# Patient Record
Sex: Female | Born: 1945 | Race: White | Hispanic: No | Smoking: Never smoker
Health system: Northeastern US, Academic
[De-identification: ages and names within clinical notes are randomized; demographics above are authoritative.]

---

## 2016-01-15 ENCOUNTER — Ambulatory Visit: Admitting: Nurse Practitioner

## 2016-09-29 ENCOUNTER — Ambulatory Visit

## 2016-10-02 LAB — HX ZZYYLEAD (VENOUS)
CASE NUMBER: 2018122001164
HX LEAD (VENOUS): <2

## 2016-10-02 LAB — HX ZINC PROTOPORPHYRIN (ZPP)
CASE NUMBER: 2018122001164
HX ZPP TO HEME RATIO: 44

## 2017-09-30 ENCOUNTER — Ambulatory Visit

## 2017-09-30 LAB — HX GLOMERULAR FILTRATION RATE (ESTIMATED)
CASE NUMBER: 2019123003744
HX AFN AMER GLOMERULAR FILTRATION RATE: 56 mL/min/{1.73_m2}
HX NON-AFN AMER GLOMERULAR FILTRATION RATE: 48 mL/min/{1.73_m2}

## 2017-09-30 LAB — HX BASIC METABOLIC PANEL
CASE NUMBER: 2019123003744
HX ANION GAP: 7 — NL (ref 3.0–11.0)
HX BUN: 18 mg/dL — NL (ref 8.0–23.0)
HX CALCIUM LVL: 9.9 mg/dL — NL (ref 8.5–10.5)
HX CHLORIDE: 107 mmol/L — NL (ref 98.0–110.0)
HX CO2: 29 mmol/L — NL (ref 21.0–32.0)
HX CREATININE: 1.14 mg/dL — NL (ref 0.55–1.3)
HX GLUCOSE LVL: 83 mg/dL — NL (ref 70.0–110.0)
HX POTASSIUM LVL: 4.3 mmol/L — NL (ref 3.6–5.2)
HX SODIUM LVL: 143 mmol/L — NL (ref 136.0–146.0)

## 2017-09-30 LAB — HX HEMOGLOBIN A1C
CASE NUMBER: 2019123003744
HX EST AVERAGE GLUCOSE (EAG): 151 mg/dL
HX HEMOGLOBIN A1C: 6.9 % — ABNORMAL HIGH
HX LA1C (INTERNAL): 1.8 % — NL
HX P3 PEAK (INTERNAL): 4.1 % — NL
HX P4 PEAK (INTERNAL): 1.4 % — NL
HX TOTAL AREA RANGE (INTERNAL): 2.38 microvolt/sec — NL (ref 1.0–3.5)

## 2018-01-10 ENCOUNTER — Ambulatory Visit: Admitting: Nurse Practitioner

## 2018-01-10 LAB — HX BASIC METABOLIC PANEL
CASE NUMBER: 2019225003621
HX ANION GAP: 10 — NL (ref 3.0–11.0)
HX BUN: 28 mg/dL — ABNORMAL HIGH (ref 8.0–23.0)
HX CALCIUM LVL: 9.3 mg/dL — NL (ref 8.5–10.5)
HX CHLORIDE: 106 mmol/L — NL (ref 98.0–110.0)
HX CO2: 26 mmol/L — NL (ref 21.0–32.0)
HX CREATININE: 1.35 mg/dL — ABNORMAL HIGH (ref 0.55–1.3)
HX GLUCOSE LVL: 115 mg/dL — ABNORMAL HIGH (ref 70.0–110.0)
HX POTASSIUM LVL: 4.6 mmol/L — NL (ref 3.6–5.2)
HX SODIUM LVL: 142 mmol/L — NL (ref 136.0–146.0)

## 2018-01-10 LAB — HX GLOMERULAR FILTRATION RATE (ESTIMATED)
CASE NUMBER: 2019225003621
HX AFN AMER GLOMERULAR FILTRATION RATE: 46 mL/min/{1.73_m2}
HX NON-AFN AMER GLOMERULAR FILTRATION RATE: 40 mL/min/{1.73_m2}

## 2018-01-10 LAB — HX LAVENDER TOP TO HOLD: CASE NUMBER: 2019225003623

## 2018-01-10 LAB — HX LIPID PANEL
CASE NUMBER: 2019225003621
HX CHOL: 108 mg/dL — NL
HX HDL: 45 mg/dL — NL
HX LDL: 31 mg/dL — NL
HX TRIG: 160 mg/dL — ABNORMAL HIGH

## 2018-01-10 LAB — HX SST GOLD TUBE TO HOLD: CASE NUMBER: 2019225003623

## 2018-01-11 LAB — HX HEMOGLOBIN A1C
CASE NUMBER: 2019225003621
HX EST AVERAGE GLUCOSE (EAG): 146 mg/dL
HX HEMOGLOBIN A1C: 6.7 % — ABNORMAL HIGH
HX LA1C (INTERNAL): 1.8 % — NL
HX P3 PEAK (INTERNAL): 4.2 % — NL
HX P4 PEAK (INTERNAL): 1.3 % — NL
HX TOTAL AREA RANGE (INTERNAL): 1.9 microvolt/sec — NL (ref 1.0–3.5)

## 2018-03-13 ENCOUNTER — Ambulatory Visit: Admitting: Nurse Practitioner

## 2019-04-02 ENCOUNTER — Ambulatory Visit

## 2020-12-10 ENCOUNTER — Inpatient Hospital Stay
Admit: 2020-12-10 | Discharge: 2020-12-11 | Disposition: A | Discharge status: Home / Self Care | Payer: MEDICARE | Attending: Emergency Medicine

## 2020-12-10 ENCOUNTER — Other Ambulatory Visit

## 2020-12-10 DIAGNOSIS — I10 Essential (primary) hypertension: Secondary | ICD-10-CM

## 2020-12-10 LAB — URINALYSIS REFLEX TO CULTURE
Bilirubin, Ur: NEGATIVE
Bilirubin, Ur: NEGATIVE
Glucose,Ur: NEGATIVE mg/dL
Glucose,Ur: NEGATIVE mg/dL
Ketones, Ur: 5 mg/dL — AB
Ketones, Ur: 5 mg/dL — AB
Leukocyte Esterase, Ur: 250 WBC/uL — AB
Leukocyte Esterase, Ur: 250 WBC/uL — AB
Nitrite, Ur: NEGATIVE
Nitrite, Ur: NEGATIVE
Protein,Ur: NEGATIVE mg/dL
Protein,Ur: NEGATIVE mg/dL
RBC, Ur: 1 /HPF (ref 0–2)
RBC, Ur: 1 /HPF (ref 0–2)
Specific Gravity, Ur: 1.002 — ABNORMAL LOW (ref 1.003–1.030)
Specific Gravity, Ur: 1.002 — ABNORMAL LOW (ref 1.003–1.030)
Squamous Epithelial Cells, Ur: 8 /HPF — ABNORMAL HIGH (ref 0–5)
Squamous Epithelial Cells, Ur: 8 /HPF — ABNORMAL HIGH (ref 0–5)
Urobilinogen, Ur: NEGATIVE
Urobilinogen, Ur: NEGATIVE
WBC, Ur: 5 /HPF (ref 0–5)
WBC, Ur: 5 /HPF (ref 0–5)
pH, Ur: 5 (ref 5.0–8.9)
pH, Ur: 5 (ref 5.0–8.9)

## 2020-12-10 LAB — URINE DRUG SCREEN
Amphetamines screen, urine: NEGATIVE
Barbiturates screen, urine: NEGATIVE
Benzodiazepines screen, urine: NEGATIVE
Buprenorphine screen, urine: NEGATIVE
Cannabinoids screen, urine: NEGATIVE
Cocaine screen, urine: NEGATIVE
Creatinine, urine, specimen validity: 300 mg/dL (ref >=20.00)
Ethanol, Urine, Value: <3 mg/dL (ref <=10)
Fentanyl screen, urine: NEGATIVE
Methadone screen, urine: NEGATIVE
Opiates screen, urine: NEGATIVE
Oxycodone screen, urine: NEGATIVE

## 2020-12-10 LAB — CBC WITH DIFFERENTIAL
Basophils %: 0.4 %
Basophils Absolute: 0.04 10*3/uL (ref 0.00–0.22)
Eosinophils %: 0.4 %
Eosinophils Absolute: 0.05 10*3/uL (ref 0.00–0.50)
Hematocrit: 42.7 % (ref 32.0–47.0)
Hemoglobin: 14.5 g/dL (ref 11.0–16.0)
Immature Granulocytes %: 0.2 %
Immature Granulocytes Absolute: 0.02 10*3/uL (ref 0.00–0.10)
Lymphocyte %: 19.7 %
Lymphocytes Absolute: 2.24 10*3/uL (ref 0.70–4.00)
MCH: 31.2 pg (ref 26.0–34.0)
MCHC: 34 g/dL (ref 31.0–37.0)
MCV: 91.8 fL (ref 80.0–100.0)
MPV: 9 fL — ABNORMAL LOW (ref 9.1–12.4)
Monocytes %: 6.5 %
Monocytes Absolute: 0.74 10*3/uL (ref 0.36–0.77)
NRBC %: 0 % (ref 0.0–0.0)
NRBC Absolute: 0 10*3/uL (ref 0.00–2.00)
Neutrophil %: 72.8 %
Neutrophils Absolute: 8.29 10*3/uL — ABNORMAL HIGH (ref 1.50–7.95)
Platelets: 270 10*3/uL (ref 150–400)
RBC: 4.65 M/uL (ref 3.70–5.20)
RDW-CV: 12.9 % (ref 11.5–14.5)
RDW-SD: 43.3 fL (ref 35.0–51.0)
WBC: 11.4 10*3/uL — ABNORMAL HIGH (ref 4.0–11.0)

## 2020-12-10 LAB — COMPREHENSIVE METABOLIC PANEL
ALT: 36 U/L (ref 0–55)
AST: 26 U/L (ref 6–42)
Albumin: 4.1 g/dL (ref 3.2–5.0)
Alkaline phosphatase: 134 U/L — ABNORMAL HIGH (ref 30–130)
Anion Gap: 5 mmol/L (ref 3–14)
BUN: 16 mg/dL (ref 6–24)
Bilirubin, total: 0.5 mg/dL (ref 0.2–1.2)
CO2 (Bicarbonate): 26 mmol/L (ref 20–32)
Calcium: 9.8 mg/dL (ref 8.5–10.5)
Chloride: 108 mmol/L (ref 98–110)
Creatinine: 1.38 mg/dL — ABNORMAL HIGH (ref 0.55–1.30)
Glucose: 106 mg/dL (ref 70–110)
Potassium: 3.9 mmol/L (ref 3.6–5.2)
Protein, total: 7.7 g/dL (ref 6.0–8.4)
Sodium: 139 mmol/L (ref 135–146)
eGFRcr: 40 mL/min/{1.73_m2} — ABNORMAL LOW (ref >=60)

## 2020-12-10 LAB — TROPONIN I, HIGH SENSITIVITY: Troponin I, High Sensitivity: <6 ng/L (ref <=59)

## 2020-12-10 LAB — TSH WITH REFLEX: TSH: 3.03 u[IU]/mL (ref 0.358–3.740)

## 2020-12-10 MED ORDER — nitrofurantoin, macrocrystal-monohydrate, (Macrobid) 100 mg capsule
100 | ORAL_CAPSULE | Freq: Two times a day (BID) | ORAL | 0 refills | 6.00000 days | Status: AC
Start: 2020-12-10 — End: 2020-12-15

## 2020-12-10 NOTE — ED Provider Notes (Signed)
 HPI   Chief Complaint   Patient presents with   ? Altered Mental Status         History provided by:  Patient and relative  Mental Health Problem  Presenting symptoms: agitation    Degree of incapacity (severity):  Moderate  Onset quality:  Gradual  Progression:  Waxing and waning  Chronicity:  New  Context: noncompliance    Treatment compliance:  Untreated  Relieved by:  Nothing                Glasgow Coma Scale Score: 15                                  Patient History   No past medical history on file.  No past surgical history on file.  No family history on file.  Social History     Tobacco Use   ? Smoking status: Not on file   ? Smokeless tobacco: Not on file   Substance Use Topics   ? Alcohol use: Not on file   ? Drug use: Not on file       Review of Systems   Review of Systems   Constitutional: Negative for chills and fever.   HENT: Negative for congestion and trouble swallowing.    Eyes: Negative for discharge and visual disturbance.   Respiratory: Negative for cough and shortness of breath.    Cardiovascular: Negative for palpitations and leg swelling.   Gastrointestinal: Negative for diarrhea and vomiting.   Endocrine: Negative for polydipsia and polyphagia.   Genitourinary: Negative for dysuria and hematuria.   Musculoskeletal: Negative for gait problem.   Neurological: Negative for seizures and syncope.   Psychiatric/Behavioral: Positive for agitation and confusion.       Physical Exam   ED Triage Vitals [12/10/20 1810]   Temp Pulse Resp BP   36.7 ?C (98 ?F) 92 16 (!) 188/79      SpO2 Temp Source Heart Rate Source Patient Position   96 % Oral Monitor Lying      BP Location FiO2 (%)     Left arm --       Physical Exam  HENT:      Head: Normocephalic and atraumatic.   Eyes:      General:         Right eye: No discharge.         Left eye: No discharge.      Pupils: Pupils are equal, round, and reactive to light.   Cardiovascular:      Rate and Rhythm: Normal rate and regular rhythm.   Pulmonary:       Effort: Pulmonary effort is normal. No respiratory distress.   Abdominal:      General: Abdomen is flat.      Tenderness: There is no abdominal tenderness.   Musculoskeletal:         General: No swelling.      Cervical back: No rigidity or tenderness.   Skin:     General: Skin is warm and dry.   Neurological:      Mental Status: She is alert.      Comments: Oriented to self only, moving all extremities       No orders to display       Labs Reviewed   COMPREHENSIVE METABOLIC PANEL - Abnormal       Result Value    Sodium 139  Potassium 3.9      Chloride 108      CO2 (Bicarbonate) 26      Anion Gap 5      BUN 16      Creatinine 1.38 (*)     eGFRcr 40 (*)     Glucose 106      Fasting? No      Calcium 9.8      AST 26      ALT 36      Alkaline phosphatase 134 (*)     Protein, total 7.7      Albumin 4.1      Bilirubin, total 0.5     CBC WITH DIFFERENTIAL - WAM AND NON-WAM - Abnormal    WBC 11.4 (*)     RBC 4.65      Hemoglobin 14.5      Hematocrit 42.7      MCV 91.8      MCH 31.2      MCHC 34.0      RDW-CV 12.9      RDW-SD 43.3      Platelets 270      MPV 9.0 (*)     Neutrophil % 72.8      Lymphocyte % 19.7      Monocytes % 6.5      Eosinophils % 0.4      Basophils % 0.4      Immature Granulocytes % 0.2      NRBC % 0.0      Neutrophils Absolute 8.29 (*)     Lymphocytes Absolute 2.24      Monocytes Absolute 0.74      Eosinophils Absolute 0.05      Basophils Absolute 0.04      Immature Granulocytes Absolute 0.02      NRBC Absolute 0.00     URINALYSIS REFLEX TO CULTURE - Abnormal    Color, Ur Straw (*)     Clarity, Ur Hazy (*)     Specific Gravity, Ur 1.002 (*)     pH, Ur 5.0      Protein,Ur Negative      Glucose,Ur Negative      Ketones, Ur 5  (*)     Bilirubin, Ur Negative      Blood, Ur Small (*)     Urobilinogen, Ur Negative      Nitrite, Ur Negative      Leukocyte Esterase, Ur 250  (*)     RBC, Ur 1      WBC, Ur 5      Squamous Epithelial Cells, Ur 8 (*)     Bacteria, Ur 4+ (*)     Mucous, Ur Few (*)    URINE DRUG  SCREEN - Normal    Amphetamines screen, urine Screen Negative      Barbiturates screen, urine Screen Negative      Benzodiazepines screen, urine Screen Negative      Buprenorphine screen, urine Screen Negative      Cannabinoids screen, urine Screen Negative      Cocaine screen, urine Screen Negative      Ethanol, Urine, Value <3      Fentanyl screen, urine Screen Negative      Methadone screen, urine Screen Negative      Opiates screen, urine Screen Negative      Oxycodone screen, urine Screen Negative      Creatinine, urine, specimen validity 300.00      Narrative:     These  are unconfirmed screening results and should be used only for medical purposes. If the clinical setting requires confirmation, contact the clinical laboratory.     TSH WITH REFLEX - Normal    TSH 3.030     TROPONIN I, HIGH SENSITIVITY - Normal    Troponin I, High Sensitivity <6     URINE CULTURE   CBC W/DIFF    Narrative:     The following orders were created for panel order CBC and differential.  Procedure                               Abnormality         Status                     ---------                               -----------         ------                     CBC w/ Differential[[id number]]           Abnormal            Final result                 Please view results for these tests on the individual orders.   URINALYSIS REFLEX TO CULTURE       ED Course & MDM   Diagnoses as of 12/10/20 2051   Hypertension, unspecified type   Agitation       MDM  Number of Diagnoses or Management Options  Agitation  Hypertension, unspecified type  Diagnosis management comments: 75 year old female with history of dementia presents from home with agitation.  She lives with her daughter.  Per EMS, she was cleaning today and then started throwing things down off the walls.  Her family was concerned and called EMS.  The patient here is without complaints.  She says she does not remember what happened earlier or why she is here.  She denies fever chills cough  nausea or vomiting.  She states she has prescribed medications but does not take them.  She is oriented to self only she is moving all her extremities.  No report of fall.    Patient with dementia and agitation not taking medications.  Medically cleared for [facility] evaluation.    Seen by [facility], plan for elder services, PCP follow up to restart medications. UA with only 5 WBC, however, given concern for behavioral changes, will cover with macrobid.             Norval Gable, MD  12/10/20 2052

## 2020-12-10 NOTE — ED Notes (Signed)
 Assumed care of pt at this time. Pt is sitting on edge of bed. Pt is anxious, stating she's been here for "two hours" and staff is "all sitting around laughing and talking" and no one is helping her. Explained Section 12 to patient and explained that [facility] has been called, but that it could be a while before they come to see patient. Explained that pt was sectioned due to her behavior with paramedics. Pt explained that there were "just so many people" around her at the time and she was scared. Asked pt if she remembers what happened today when she was cleaning. Pt states she was just cleaning the house today. Asked pt if she remembers throwing things and pt stated that was her son that did that, not her.     Penelope Galas, RN  12/10/20 1921

## 2020-12-10 NOTE — Other (Signed)
 Patient Education   Table of Contents       Dementia Caregiver Guide     To view videos and all your education online visit,   [email or web address].elsevier.[email or web address]   or scan this QR code with your smartphone.                    Dementia Caregiver Guide     Dementia is a term used to describe a number of symptoms that affect memory and thinking. The most common symptoms include:       Memory loss.       Trouble with language and communication.       Trouble concentrating.       Poor judgment and problems with reasoning.       Wandering from home or public places.       Extreme anxiety or depression.       Being suspicious or having angry outbursts and accusations.       Child-like behavior and language.     Dementia can be frightening and confusing. And taking care of someone with dementia can be challenging. This guide provides tips to help you when providing care for a person with dementia.   How to help manage lifestyle changes   Dementia usually gets worse slowly over time. In the early stages, people with dementia can stay independent and safe with some help. In later stages, they need help with daily tasks such as dressing, grooming, and using the bathroom. There are actions you can take to help a person manage his or her life while living with this condition.   Communicating         When the person is talking or seems frustrated, make eye contact and hold the person's hand.       Ask specific questions that need yes or no answers.       Use simple words, short sentences, and a calm voice. Only give one direction at a time.       When offering choices, limit the person to just one or two.       Avoid correcting the person in a negative way.       If the person is struggling to find the right words, gently try to help him or her.     Preventing injury            Keep floors clear of clutter. Remove rugs, magazine racks, and floor lamps.       Keep hallways well lit, especially at night.       Put a handrail and  nonslip mat in the bathtub or shower.       Put childproof locks on cabinets that contain dangerous items, such as medicines, alcohol, guns, toxic cleaning items, sharp tools or utensils, matches, and lighters.       For doors to the outside of the house, put the locks in places where the person cannot see or reach them easily. This will help ensure that the person does not wander out of the house and get lost.       Be prepared for emergencies. Keep a list of emergency phone numbers and addresses in a convenient area.       Remove car keys and lock garage doors so that the person does not try to get in the car and drive.       Have the person wear a bracelet that tracks locations and identifies the person  as having memory problems. This should be worn at all times for safety.     Helping with daily life            Keep the person on track with his or her routine.       Try to identify areas where the person may need help.       Be supportive, patient, calm, and encouraging.       Gently remind the person that adjusting to changes takes time.       Help with the tasks that the person has asked for help with.       Keep the person involved in daily tasks and decisions as much as possible.       Encourage conversation, but try not to get frustrated if the person struggles to find words or does not seem to appreciate your help.         How to recognize stress    Look for signs of stress in yourself and in the person you are caring for. If you notice signs of stress, take steps to manage it. Symptoms of stress include:       Feeling anxious, irritable, frustrated, or angry.       Denying that the person has dementia or that his or her symptoms will not improve.       Feeling depressed, hopeless, or unappreciated.       Difficulty sleeping.       Difficulty concentrating.       Developing stress-related health problems.       Feeling like you have too little time for your own life.     Follow these instructions at home:    Take care of your health       Make sure that you and the person you are caring for:       Get regular sleep.       Exercise regularly.       Eat regular, nutritious meals.       Take over-the-counter and prescription medicines only as told by your health care providers.       Drink enough fluid to keep your urine pale yellow.       Attend all scheduled health care appointments.     General instructions         Join a support group with others who are caregivers.       Ask about respite care resources. Respite care can provide short-term care for the person so that you can have a regular break from the stress of caregiving.       Consider any safety risks and take steps to avoid them.       Organize medicines in a pill box for each day of the week.       Create a plan to handle any legal or financial matters. Get legal or financial advice if needed.       Keep a calendar in a central location to remind the person of appointments or other activities.       Where to find support:    Many individuals and organizations offer support. These include:       Support groups for people with dementia.       Support groups for caregivers.       Counselors or therapists.       Home health care services.       Adult day care centers.  Where to find more information         Centers for Disease Control and Prevention: FootballExhibition.com.br         Alzheimer's Association: LimitLaws.hu         Family Caregiver Alliance: www.caregiver.org         Alzheimer's Foundation of [place]: www.alzfdn.org       Contact a health care provider if:         The person's health is rapidly getting worse.       You are no longer able to care for the person.       Caring for the person is affecting your physical and emotional health.       You are feeling depressed or anxious about caring for the person.     Get help right away if:         The person threatens himself or herself, you, or anyone else.       You feel depressed or sad, or feel that you want to  harm yourself.     If you ever feel like your loved one may hurt himself or herself or others, or if he or she shares thoughts about taking his or her own life, get help right away. You can go to your nearest emergency department or:      Call your local emergency services (911 in the [place]      Call a suicide crisis helpline, such as the National Suicide Prevention Lifeline at 7572198743. This is open 24 hours a day in the [place]      Text the Crisis Text Line at (769)880-4749 (in the [place]     Summary         Dementia is a term used to describe a number of symptoms that affect memory and thinking.       Dementia usually gets worse slowly over time.       Take steps to reduce the person's risk of injury and to plan for future care.       Caregivers need support, relief from caregiving, and time for their own lives.     This information is not intended to replace advice given to you by your health care provider. Make sure you discuss any questions you have with your health care provider.     Document Released: 11/21/2017Document Revised: 05/03/2021Document Reviewed: 10/01/2019     Elsevier Patient Education ? 2022 Elsevier Inc.

## 2020-12-10 NOTE — ED Triage Notes (Signed)
 Pt placed on section 12 as EMS was unable to get pt to come to ed. On the way to ED pt would not stop fighting with ems and kept taking her seat belt off.

## 2020-12-10 NOTE — ED Triage Notes (Signed)
 BIBA from home. Family states she has had a rapid decline in mental status. Pt does have dementia. Family states she was "rapidly cleaning and then began taking things off the wall and throwing them". On arrival to ED pt confused but calm and cooperative.

## 2020-12-10 NOTE — ED Notes (Signed)
 Called [facility] for referral, per Dr. Felipa Furnace.     Penelope Galas, RN  12/10/20 1908

## 2020-12-10 NOTE — Discharge Instructions (Signed)
 Follow up with PCP for formal dementia testing and to get restarted on blood pressure medications.

## 2020-12-10 NOTE — ED Progress Note (Signed)
 DARP    Risk Factors:  Denies SI/HI and PS's daughter also reports no SI/HI concern. There is concern of dementia      Protective Factors:  Support from family    Suicide Risk Level:  Low risk.    Action:  PS is a 75 y/o widowed female who was BIBA to [facility] ED via Sec. 12 by [place] [employer]. PS present alert and is oriented to self but disoriented to location, time, and situation. PS would answer questions with, ?That?s a good question.? PS would share that her daughter should be the one to answer the questions.  PS reports he was last at home and how she had coffee and bread as her breakfast. PS would report she has been waiting in the ED for 6-7 hours. PS has only been at the ED for an hour. PS would deny SI/HI and would report how she and her family like to help people ?not hurt them.? PS would deny AH/VH and does not present with any internal stimuli. PS reports she feels healthy and wants to return home.      Response:  Spoke with Officer Boykin Reaper with [place] [employer] who was a Engineer, drilling.  Officer Jeannette Corpus reports that the call was from [employer] to assist their EMS team with a client. Officer Jeannette Corpus reports that upon arrival they found PS refusing care and she appeared confused. Officer Jeannette Corpus reports that the family was interviewed and there was concern that she had been removing items around the home and putting them in her room and making a pile on her bed. Officer Miano reports that PS had also been acting aggressive towards her daughter. Officer Miano reports that PS?s blood pressure was considerably high and EMS recommended medical eval and so the officers assisted in getting PS to remain on the gurney and brought to the ED for her safety. Officer Jeannette Corpus was able to share the phone numbers to PS?s daughter and daughter-in-law who were present at the home.     Met with PS?s daughter, [name], at the ED. [name] reports that she, her husband, and their daughter moved in to take care of PS back  in 2019. [name] reports that PS had retired in January 2019.[name] reports that PS has refused to regular check-ups with her PCP and has also been refusing to take her prescribed medications. [name] reports that over the last six months she has noticed PS become progressively worse with her memory and that there is concern for dementia. [name] reports that PS?s mother had been dx with dementia around the same age and passed away at age 41. [name] reports that PS has been a widow since 2012. [name] reports that the family had owned a cat for a few months but recently had to give the cat away and this has likely affected PS as well. [name] reports how PS was quite bonded with the cat. [name] reports that PS would present confused at times and share how the cat was still around or would even think her granddaughter is the cat. [name] reports that PS has struggled lately with communicating and she appears to become agitated from this. [name] reports that today PS presented more confused than seen before and how PS had spend considerable time out in the sun so there was concern of dehydration and impact to her blood pressure. [name] reports that given how PS was being aggressive towards the family when they were trying to help, the decision was made to seek  help and so 911 was called. [name] reports PS did not want to be checked by the EMS and was not agreeing to the come to the ED. [name] reports PS has not endorsed SI/HI and there has been no AH/VH. [name] is open to having PS return home and they hope for her to engage with the PCP for further review on her conditions.     Plan:  At this time clinical formulation supports D/C. PS is a 75 y/o female who presents with anxious mood and mood congruent affect. PS presents alert and oriented to self but disoriented to location, time, and situation. PS presents confused but is cooperative and remained calm during the eval. PS would answer  questions with, ?That?s a good question.? PS would share that her daughter should be the one to answer the questions. PS would deny SI/HI and would report how she and her family like to help people ?not hurt them.? PS would deny AH/VH and does not present with any internal stimuli. PS reports she feels healthy and wants to return home.      PS?s daughter has concerns of dementia and shares how PS?s memory has been progressively getting worse. There is family hx of dementia. PS?s daughter reports PS has not been taking her medications and also has not been willing to engage with her PCP. PS?s daughter reports PS has been increasing confused and more agitated. Today it was difficult to understand what PS was trying to communicate and so this led her to become aggressive. PS?s daughter also had medical concerns given PS?s hx of high blood pressure and wanted PS to be checked out medically. PS?s daughter reports PS has not endorsed SI/HI and there has been no AH/VH. PS?s daughter is open to having PS return home and they hope for her to engage with the PCP for further review on her conditions.      [facility] supports the disposition.    Attending Physician- Dr. Norval Gable is aware of the disposition.

## 2020-12-26 ENCOUNTER — Other Ambulatory Visit (HOSPITAL_BASED_OUTPATIENT_CLINIC_OR_DEPARTMENT_OTHER)

## 2020-12-26 ENCOUNTER — Ambulatory Visit: Admit: 2021-02-26 | Payer: MEDICARE

## 2020-12-27 ENCOUNTER — Ambulatory Visit (HOSPITAL_BASED_OUTPATIENT_CLINIC_OR_DEPARTMENT_OTHER)

## 2020-12-27 LAB — HEPATIC FUNCTION PANEL
ALT: 34 U/L (ref 0–55)
AST: 26 U/L (ref 6–42)
Albumin: 3.8 g/dL (ref 3.2–5.0)
Alkaline phosphatase: 122 U/L (ref 30–130)
Bilirubin, direct: 0.1 mg/dL (ref 0.0–0.5)
Bilirubin, total: 0.6 mg/dL (ref 0.2–1.2)
Protein, total: 6.9 g/dL (ref 6.0–8.4)

## 2020-12-27 LAB — BASIC METABOLIC PANEL
Anion Gap: 5 mmol/L (ref 3–14)
BUN: 17 mg/dL (ref 6–24)
CO2 (Bicarbonate): 29 mmol/L (ref 20–32)
Calcium: 9.1 mg/dL (ref 8.5–10.5)
Chloride: 107 mmol/L (ref 98–110)
Creatinine: 1.11 mg/dL (ref 0.55–1.30)
Glucose: 63 mg/dL — ABNORMAL LOW (ref 70–110)
Potassium: 4 mmol/L (ref 3.6–5.2)
Sodium: 141 mmol/L (ref 135–146)
eGFRcr: 52 mL/min/{1.73_m2} — ABNORMAL LOW (ref >=60)

## 2020-12-27 LAB — CBC
Hematocrit: 44.6 % (ref 32.0–47.0)
Hemoglobin: 14.1 g/dL (ref 11.0–16.0)
MCH: 30.8 pg (ref 26.0–34.0)
MCHC: 31.6 g/dL (ref 31.0–37.0)
MCV: 97.4 fL (ref 80.0–100.0)
MPV: 9.6 fL (ref 9.1–12.4)
NRBC %: 0 % (ref 0.0–0.0)
NRBC Absolute: 0 10*3/uL (ref 0.00–2.00)
Platelets: 246 10*3/uL (ref 150–400)
RBC: 4.58 M/uL (ref 3.70–5.20)
RDW-CV: 13.6 % (ref 11.5–14.5)
RDW-SD: 48.3 fL (ref 35.0–51.0)
WBC: 7.8 10*3/uL (ref 4.0–11.0)

## 2020-12-27 LAB — VITAMIN B12: Vitamin B-12: 403 pg/mL (ref 193–986)

## 2020-12-27 LAB — ALBUMIN, URINE, RANDOM (INCLUDING CREATININE)
Albumin, urine: 7.6 mg/L (ref <=30.0)
Albumin/Creatinine ratio: 23.2 mg/g{creat} (ref 0.0–30.0)
Creatinine, urine, random (INT/EXT): 32.8 mg/dL (ref 15.00–328.00)

## 2020-12-27 LAB — LIPID PANEL
Cholesterol: 210 mg/dL — ABNORMAL HIGH (ref <=200)
HDL cholesterol: 41 mg/dL (ref >=40)
LDL cholesterol, calculated: 132 mg/dL — ABNORMAL HIGH (ref 0–130)
Triglycerides: 184 mg/dL — ABNORMAL HIGH (ref <=150)

## 2020-12-27 LAB — FERRITIN: Ferritin: 119.6 ng/mL (ref 10.0–307.0)

## 2020-12-27 LAB — HEMOGLOBIN A1C
Estimated Average Glucose mg/dL (INT/EXT): 120 mg/dL
HEMOGLOBIN A1C % (INT/EXT): 5.8 % — ABNORMAL HIGH (ref <5.6)

## 2020-12-27 LAB — TSH WITH REFLEX: TSH: 0.799 u[IU]/mL (ref 0.358–3.740)

## 2020-12-27 LAB — FOLATE: Folate: 14.2 ng/mL (ref >=3.5)

## 2021-05-11 ENCOUNTER — Encounter

## 2021-05-11 ENCOUNTER — Other Ambulatory Visit

## 2021-05-11 NOTE — Progress Notes (Signed)
 Is the patient appropriate for Home Care? {YES,NO:32825}    Homebound Status: ***  Covid status: ***  Insurance carrier: Payor: [place] [employer] / Plan: [place] [employer] [place] / Product Type: *No Product type* /     Date of potential discharge: ***    Is there a next day need? {TMCAH NEXT DAY ZOXW:96045}    Does the patient have a PCP? {TM[facility] Liaison WUJ:81191}    Is the patient a [facility] patient?{TMCAH Patient?:32536}    Does patient use Home O2{TMCAH Home O2:32533}    Have your cordinated with the IP care team on the patients IV? {YES,NO:32825}  Have your coordinated with the IP care team on the patients Drains? {YES,NO:32825}  Have your coordinated with the IP care team on the patients Wounds? {YES,NO:32825}

## 2021-05-13 ENCOUNTER — Telehealth

## 2021-05-13 NOTE — Telephone Encounter (Signed)
 Message left for Dr. Cherly Hensen regarding delayed home care services.  Office is closed, awaiting a call back

## 2021-05-18 ENCOUNTER — Other Ambulatory Visit (HOSPITAL_BASED_OUTPATIENT_CLINIC_OR_DEPARTMENT_OTHER)

## 2021-06-04 ENCOUNTER — Encounter

## 2021-06-04 ENCOUNTER — Other Ambulatory Visit (HOSPITAL_BASED_OUTPATIENT_CLINIC_OR_DEPARTMENT_OTHER)

## 2021-06-04 ENCOUNTER — Ambulatory Visit: Payer: MEDICARE

## 2021-06-04 DIAGNOSIS — F0390 Unspecified dementia without behavioral disturbance: Secondary | ICD-10-CM

## 2021-07-14 ENCOUNTER — Ambulatory Visit: Payer: MEDICARE

## 2021-07-30 ENCOUNTER — Inpatient Hospital Stay
Admission: EM | Admit: 2021-07-30 | Discharge: 2021-08-04 | Disposition: A | Discharge status: Other Acute Inpatient Hospital | Payer: MEDICARE | Admitting: Emergency Medicine

## 2021-07-30 ENCOUNTER — Other Ambulatory Visit

## 2021-07-30 DIAGNOSIS — R451 Restlessness and agitation: Secondary | ICD-10-CM

## 2021-07-30 DIAGNOSIS — F03911 Unspecified dementia, unspecified severity, with agitation: Principal | ICD-10-CM

## 2021-07-30 LAB — URINALYSIS REFLEX TO CULTURE
Bilirubin, Ur: NEGATIVE
Casts, Ur: 12 /LPF — ABNORMAL HIGH (ref 0–4)
Epithelial Cells, UR: 9 {cells}/[HPF] — ABNORMAL HIGH (ref 0–5)
Glucose,Ur: NEGATIVE mg/dL
Ketones, Ur: NEGATIVE mg/dL
Nitrite, Ur: NEGATIVE
Protein,Ur: 30 mg/dL — AB
RBC, Ur: 1 {cells}/[HPF] (ref 0–4)
Specific Gravity, Ur: 1.015 (ref 1.005–1.030)
Urobilinogen, Ur: 0.2 U/dL (ref 0.2–1.0)
WBC, Ur: 8 {cells}/[HPF] — ABNORMAL HIGH (ref 0–5)
pH, Ur: 5 (ref 5.0–8.0)

## 2021-07-30 LAB — URINE DRUG SCREEN
Amphetamines screen, urine: NEGATIVE
Barbiturates screen, urine: NEGATIVE
Benzodiazepines screen, urine: NEGATIVE
Buprenorphine screen, urine: NEGATIVE
Cannabinoids screen, urine: NEGATIVE
Cocaine screen, urine: NEGATIVE
Creatinine, urine, specimen validity: 91.8 mg/dL (ref >=20.00)
Ethanol, Urine, Value: <3 mg/dL (ref <=10)
Fentanyl screen, urine: NEGATIVE
Methadone screen, urine: NEGATIVE
Opiates screen, urine: NEGATIVE
Oxycodone screen, urine: NEGATIVE

## 2021-07-30 LAB — RAPID SARS-COV-2: SARS-CoV-2 RNA PCR: NOT DETECTED

## 2021-07-30 MED ORDER — haloperidol lactate (Haldol) injection 2 mg
5 | Freq: Once | INTRAMUSCULAR | Status: AC
Start: 2021-07-30 — End: 2021-07-30
  Administered 2021-07-31: 02:00:00 2 mg via INTRAMUSCULAR

## 2021-07-30 MED ORDER — haloperidol (Haldol) tablet 2 mg
1 | Freq: Once | ORAL | Status: DC
Start: 2021-07-30 — End: 2021-07-31

## 2021-07-30 MED ORDER — haloperidol lactate (Haldol) injection 5 mg
5 | Freq: Once | INTRAMUSCULAR | Status: AC
Start: 2021-07-30 — End: 2021-07-30
  Administered 2021-07-31: 03:00:00 5 mg via INTRAMUSCULAR

## 2021-07-30 NOTE — ED Notes (Signed)
Pt's daughter and son at bedside with pt. Per family states they are not safe with the patient at home. Pt has been known to have aggressive behavior towards herself. They are more concern for her safety. As pt has been known to wander around from the house. Family has received phone calls from neighbors when they notice the pt walking around not appearing to know where she is going. Family is in the works having pt placed in a facility, attempting to obtain legal paper works.      Marianne Sofia, RN  07/30/21 1556

## 2021-07-30 NOTE — ED Progress Note (Signed)
I have received sign out from the off going provider. Pt is a 76 y/o female with a h/o dementia who presents with increasing agitation, paranoia, abnormal behavior, dangerous behavior.     Vitals:    07/30/21 1711 07/30/21 1938 07/30/21 1939 07/31/21 0611   BP: 126/78 118/75 134/79 121/75   BP Location: Left arm Right arm Right arm Right arm   Patient Position: Sitting Lying Sitting Sitting   Pulse: 84 74  75   Resp: 17 16 16 16    Temp: 37.5 C (99.5 F) 36.9 C (98.4 F) 36.8 C (98.2 F) 36.8 C (98.3 F)   TempSrc: Oral Oral Oral Oral   SpO2: 97% 100% 97% 98%       Labs Reviewed   URINALYSIS REFLEX TO CULTURE - Abnormal       Result Value    Color, Ur Yellow      Clarity, Ur Cloudy (*)     Specific Gravity, Ur 1.015      pH, Ur 5.0      Protein,Ur 30 (*)     Glucose,Ur Negative      Ketones, Ur Negative      Bilirubin, Ur Negative      Blood, Ur Small (*)     Urobilinogen, Ur 0.2      Nitrite, Ur Negative      Leukocyte Esterase, Ur Trace (*)     RBC, Ur 1      WBC, Ur 8 (*)     Epithelial Cells, UR 9 (*)     Bacteria, Ur Trace (*)     Casts, Ur 12 (*)     Granular Cast, UR Present (*)    URINE DRUG SCREEN - Normal    Amphetamines screen, urine Screen Negative      Barbiturates screen, urine Screen Negative      Benzodiazepines screen, urine Screen Negative      Buprenorphine screen, urine Screen Negative      Cannabinoids screen, urine Screen Negative      Cocaine screen, urine Screen Negative      Ethanol, Urine, Value <3      Fentanyl screen, urine Screen Negative      Methadone screen, urine Screen Negative      Opiates screen, urine Screen Negative      Oxycodone screen, urine Screen Negative      Creatinine, urine, specimen validity 91.80      Narrative:     These are unconfirmed screening results and should be used only for medical purposes. If the clinical setting requires confirmation, contact the clinical laboratory.     RAPID SARS-COV-2 - Normal    SARS-CoV-2 RNA PCR Not Detected     COVID-19 AND OTHER  RESPIRATORY VIRAL PCR TESTING    Narrative:     The following orders were created for panel order COVID-19 and Other Respiratory Viral PCR Tests.  Procedure                               Abnormality         Status                     ---------                               -----------         ------  Rapid SARS-CoV-2[[id number]]             Normal              Final result                 Please view results for these tests on the individual orders.         Labs and vitals are unremarkable. On exam today she is seen in room 14. She is sitting in a chair. She has unremarkable vitals. She has no compalints. She is conversant.  Well appearing.     Will continue on geripsych bed search.     Mathis Dad, MD  07/31/21 [phone]

## 2021-07-30 NOTE — ED Notes (Signed)
Pt resting comfortably at this time. In the room walking around. Ate dinner that was provided to pt. Continues on observation.      Marianne Sofia, RN  07/30/21 1920

## 2021-07-30 NOTE — Unmapped (Signed)
 ED Observation Discharge Summary:     Clinical Course: Patient brought in by family after being agitated and aggressive at home, history of dementia and was out of control.  She has been medically cleared and is being transferred to an inpatient geriatric psychiatric facility      Patient awake, cooperative, talking and confused at baseline  Diagnosis: Dementia, agitation    Disposition:Hospitalize     Carmell Austria, MD  08/04/21 1115

## 2021-07-30 NOTE — ED Provider Notes (Signed)
HPI   Chief Complaint   Patient presents with   . Behavioral Health Evaluation       HPI    Patient with history of worsening dementia over the course of months and years, presents due to increasing aggression and abnormal behavior at home.  Family report that she is oftentimes looking out the glass door stating that there is people outside watching her in the dark, more recently she has several times gone out and walked up to 2 miles from the home in an adequate clothing considering the cold weather, and they are concerned about her safety.  She had been evaluated here in the past, they have been in the process of trying to find a placement for her but have not actually found one as of yet.  No suspected falls or injuries, she oftentimes is breaking things in the home and can be aggressive but they do not think she is hurt herself.  She typically refuses medications.  No other recent illness, cough, fever vomiting or diarrhea, other complaints              Glasgow Coma Scale Score: 14                                  Patient History   No past medical history on file.  No past surgical history on file.  No family history on file.  Social History     Tobacco Use   . Smoking status: Not on file   . Smokeless tobacco: Not on file   Substance Use Topics   . Alcohol use: Not on file   . Drug use: Not on file       Review of Systems   Review of Systems    Physical Exam   ED Triage Vitals   Temp Pulse Resp BP   07/30/21 1355 07/30/21 1353 07/30/21 1353 07/30/21 1353   36.3 C (97.4 F) 92 18 (!) 187/73      SpO2 Temp Source Heart Rate Source Patient Position   07/30/21 1353 07/30/21 1355 -- --   96 % Oral        BP Location FiO2 (%)     07/30/21 1353 --     Right arm        Physical Exam  Vitals and nursing note reviewed.   Constitutional:       General: She is not in acute distress.     Appearance: She is well-developed.   Cardiovascular:      Rate and Rhythm: Normal rate and regular rhythm.      Heart sounds: No murmur  heard.  Pulmonary:      Effort: Pulmonary effort is normal. No respiratory distress.      Breath sounds: Normal breath sounds.   Abdominal:      Palpations: Abdomen is soft.      Tenderness: There is no abdominal tenderness.   Musculoskeletal:         General: No swelling.      Cervical back: Neck supple.   Skin:     General: Skin is warm and dry.      Capillary Refill: Capillary refill takes less than 2 seconds.   Neurological:      Mental Status: She is alert.      Comments: A&o x0, pacing in room, redirectable, 5/5 all ext no focal deficit   Psychiatric:  Mood and Affect: Mood normal.       No orders to display       Labs Reviewed   COVID-19 AND OTHER RESPIRATORY VIRAL PCR TESTING    Narrative:     The following orders were created for panel order COVID-19 and Other Respiratory Viral PCR Tests.  Procedure                               Abnormality         Status                     ---------                               -----------         ------                     Rapid SARS-CoV-2[[id number]]                                                              Please view results for these tests on the individual orders.   URINE DRUG SCREEN   RAPID SARS-COV-2       ED Course & MDM   Diagnoses as of 07/30/21 2145   Agitation   Dementia, unspecified dementia severity, unspecified dementia type, unspecified whether behavioral, psychotic, or mood disturbance or anxiety (CMS/HCC)     D/w bh who agree with geri psych placement  Pt increasingly agitated here, repeatedly trying to leave, requiring IM anxiolysis  Family understanding of plan    MDM         Earma Reading, MD  07/30/21 2145

## 2021-07-30 NOTE — Initial Assessments (Addendum)
Emergency Services Psychiatric Evaluation     PATIENT NAME: Kim Blake  DATE OF BIRTH:[birth year]   MEDICAL RECORD #: [id number]  DATE OF ADMISSION: 07/30/2021     Chief Complaint:  Pt brought in by children due to escalating safety behaviors, wandering outside the home, erratic behaviors, confusion, disoriented, and today pt ws found outside naked in the cold weather, daughter had to coax pt to come in and to get dress. Daughter, Kim Blake, and son, Kim Blake present with mom. Pt is wandering around room 14, looking out into ED, this writer (TW) asked pt to sit in bed or chair, pt sat in chair. Pt is oriented to herself and to her children otherwise cannot state she is in a hospital, nor tell the date or year, she repeatedly points to hospital band when trying to answer these questions about date and time. Pt also pointing to bathroom inside room 14 and saying something inaudible. At this point asked to meet with children in family room while pt stayed in room 14.    Met with Kim Blake and Louis separately in the family room. They report pt's behaviors are escalating and Kim Blake no longer feels she can keep pt safe in the home. Pt wakes up in the middle of the night and open and closes slider door repetitively and has done this with other doors in home to the point that 4 of their doors have broken door frames. Pt does not know why she is doing this. Recently pt has been found outside naked, and Kim Blake has to get her inside and dressed. Pt is not showering at all, Kim Blake suspects pt is cleaning self face cloth bath.. In the past year behaviors have escalated, family has ring doorbell and automatic lights which alert family when pt is outside. Pt does not know when she needs to toilet and is having accidents all day every day. Pt is checking mailbox repeatedly and often goes outside with no shoes or socks on. Pt was seen by PCP on 07/21/21 and it was discussed that Kim Blake would seek Conservatorship and family  would like pt to get care and eventually go into a memory care facility. Family is working with an Pensions consultant around legal issues, and PCP has provided documentation so that Kim Blake can be appointed Horticulturist, commercial. Kim Blake is currently HCP. Pt's behaviors have become a source of stress for Kim Blake's family (husband and daughter) and Kim Blake and brother are seeking help.      History of Present Illness: No history of mental illness or substance abuse.    Stressors: Confusion, disoriented, cannot make know her wants and needs.        Past Psychiatric History:   Previous therapy: No  Previous psychiatric treatment and medication trials: No  Previous psychiatric hospitalizations: No  Previous diagnoses: Dementia  Previous suicide attempts: No  History of violence: No, however, pt can become agitated and needs redirection  Currently in treatment No  Other pertinent history: appt with PCP on 07/21/21, no meds for Dementia Rx, no referrals to geri-psych care nor a neurologist  Family History of Mental Illness: brother - schizophrenia - deceased x November 02, 2001 or 2002/11/03.    Substance Abuse History  Recreational drugs: No  Use of alcohol: No  Use of caffeine: drinks coffee  Tobacco use: no  Legal consequences of chemical use: no  Family History of Substance use/addiction: brother used to drink    Trauma History: denies    Past Medical History: children reports a series  of mini strokes detected from a CT scan recently    Social/Legal History: Pt married for many years until husband's death in 23-Oct-2012, two children, both present today in ED. 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 Daughter moved in to supervise pt in Oct 23, 2017, and then daughter had to quit her job on May 05, 2021 after pt was found 2 miles away from home, a neighbor happen to be stopped at a gas station and recognized pt and took pt home. Neighbor called daughter at work, and when daughter arrived home, pt was  outside cutting down large limbs from trees lining the property. Pt did not know why she was doing this according to [place]. From the day onward, Kim Blake has been home supervising mother day and night.        Psychiatric Review Of Systems:  Sleep: pt sometimes awake during night and found open and closing back sliding door repeatedly.  Appetite changes: No, appetite is good  Weight changes: No  Energy: spurts of energy at all hours, pt likes to be active and doing things with hands, puzzles for instance  Interest/pleasure/anhedonia: unable to ascertain  Somatic symptoms: no complaints, pt reports feeling good  Anxiety/panic: no  Guilty/hopeless: no  Self-injurious behavior/risky behavior: no, although in her confused state, pt can be found outside with no shoes or socks or clothes on.  Access to weapons: no, however, access to gardening tools like bow saw, pruning saw      Mental Status Evaluation:  Appearance:  looks stated age, hair is dirty and messy, wearing eye glasses   Behavior:  Calm, restless, up and down and cannot sit still, anxious behaviors   Speech:  Mumbles at times, family reports pt has reverted to speaking [place], has taken up swearing which she never did as an adult   Mood:  "I feel fine"   Affect:  Appears pleasant, smiling, some anxiety   Thought Process:  Confused, disorganized   Thought Content:  Poverty of content   Sensorium:  Oriented to self and children, cannot state she is in a hospital, does not know date or year, points to her hospital band   Cognition:  disorganized   Insight:  poor   Impulse control tenuous   Judgment:  poor     Risk factors: No medications for dementia, no referrals for geri-psych care, nor for neurology, non compliance with meds for HBP and Cholesterol, needs 24 hour supervision. Sundowning reported by family.    Protective factors: Good family support, stable housing, [place] where relatives also live as well as long time neighbors who look out  for pt. Daughter reports pt took her HBP and Cholesterol medications today. Pt sometimes can sleep through the night.      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 The family is struggling to maintain 24 hour supervision of pt and the stress and worry is overwhelming. Pt is not receiving any care for her Dementia, no medications, no referrals, and no geri-psych care. The family has done a tremendous job keeping pt in their family home for several years, but the tools  they have implemented to help keep pt safe (ring doorbell and motion sensor lights) are not enough. Daughter Kim Blake had to quit her job in December 2022 after pt found wandering 2 miles away from home, on a busy road and a neighbor happened to see pt and drove her home. Family reports some sundowning behaviors, waking up in the middle of night and opening and closing sliding door repetitively for almost 200 times which wakes the whole house. Pt found outside today naked and pt did not realize she had no clothes on and did not understand. Pt is not showering, she refuses to take medications for high blood pressure and high cholesterol, urinates and defecates all over the house and cannot communicate her wants and needs. At this time pt meets threshold for involuntary hospitalization due to her presentation, her inability to care for herself, and not understanding her actions and consequences. Kim Blake is the HCP and will sign pt into a geri-psych unit. Discussed where there are geri-psych units and family asking to start with [facility] in [place] for its proximity to family.      Recommendations and plan: placed pt on section 12 due to gross  impairment of insight and judgment, geri-psych bed search, keep family informed of process, Kenlyn Lose [phone] and son Kim Blake, [phone]. Family made aware that pt may need medication if she becomes agitated throughout the night or while waiting for placement. Family understands and states they are ok with medication if needed.    Consult with ED doctor, informed doctor family is aware pt may need medication if she becomes agitated. Pt is an inpatient geri-psych bed search.      Larina Earthly, LICSW

## 2021-07-30 NOTE — ED Notes (Signed)
Pt resting comfortably at this time. Standing in the room. Does not appear to be in pain or distress     Marianne Sofia, RN  07/30/21 1535

## 2021-07-30 NOTE — ED Triage Notes (Signed)
BIBA from home on a section 12. Per PD and ems daughter reports pt has increase agitation for a while. Pt has been noted to punching walls and hitting the door. Has been outside dressed inappropriately for the weather. Pt was uncooperative for ems.

## 2021-07-30 NOTE — ED Progress Note (Signed)
[facility]  [address]  [place] [address]    PATIENT  Kim Blake  DOB: Oct 22, 1945, MRN: [id number]  ED Visit Date: Thu Jul 30, 2021 1343      ED Progress Note  08/04/21 @ 7:28 AM    76 yo f here for increased agitation, brought in by family who did not feel she was safe at home.  Pt medically cleared, seen by [facility].  Pt pending geriatric psychiatric placement.    Last Vital Signs  Temp: 36.8 C (98.3 F) Oral  Pulse: 87  BP: 129/74  Resp: 17  SpO2: 98 % Oxygen Therapy: None (Room air)    Results  Abnormal Labs Reviewed   URINALYSIS REFLEX TO CULTURE - Abnormal; Notable for the following components:       Result Value    Clarity, Ur Cloudy (*)     Protein,Ur 30 (*)     Blood, Ur Small (*)     Leukocyte Esterase, Ur Trace (*)     WBC, Ur 8 (*)     Epithelial Cells, UR 9 (*)     Bacteria, Ur Trace (*)     Casts, Ur 12 (*)     Granular Cast, UR Present (*)     All other components within normal limits   POCT GLUCOSE - Abnormal; Notable for the following components:    POCT Glucose 371 (*)     All other components within normal limits   POCT GLUCOSE - Abnormal; Notable for the following components:    POCT Glucose 226 (*)     All other components within normal limits   POCT GLUCOSE - Abnormal; Notable for the following components:    POCT Glucose 119 (*)     All other components within normal limits     No orders to display       ED COURSE/MEDICAL DECISION MAKING  ED Medication Administration from 07/30/2021 1343 to 08/04/2021 1116       Date/Time Order Dose Route Action Action by     07/30/2021 2110 EST haloperidol (Haldol) tablet 2 mg 2 mg oral Not Given Cuccio, R     07/30/2021 2128 EST haloperidol lactate (Haldol) injection 2 mg 2 mg intramuscular Given Cuccio, R     07/30/2021 2200 EST haloperidol lactate (Haldol) injection 5 mg 5 mg intramuscular Given Cuccio, R     07/31/2021 1631 EST QUEtiapine (SEROquel) tablet 25 mg 25 mg oral Given Aster, S     07/31/2021 2100 EST  QUEtiapine (SEROquel) tablet 25 mg 25 mg oral Not Given Champney, D     07/31/2021 2157 EST QUEtiapine (SEROquel) tablet 25 mg 25 mg oral Given Champney, D     08/01/2021 1005 EST QUEtiapine (SEROquel) tablet 25 mg 25 mg oral Given Garald Balding, E     08/01/2021 1006 EST QUEtiapine (SEROquel) tablet 25 mg 25 mg oral Given Garald Balding, E     08/01/2021 1244 EST lisinopril tablet 12.5 mg 12.5 mg oral Given Garald Balding, E     08/01/2021 1244 EST rosuvastatin (Crestor) tablet 40 mg 40 mg oral Given Lendon Collar     08/01/2021 2058 EST QUEtiapine (SEROquel) tablet 25 mg 25 mg oral Given Larinda Buttery     08/02/2021 0831 EST QUEtiapine (SEROquel) tablet 25 mg 25 mg oral Given Pare, J     08/02/2021 1348 EST lisinopril tablet 12.5 mg 12.5 mg oral Given Pare, J     08/02/2021 1348 EST rosuvastatin (Crestor) tablet 40  mg 40 mg oral Given Cornelius Moras     08/02/2021 2046 EST QUEtiapine (SEROquel) tablet 25 mg 25 mg oral Given Melida Quitter, L     08/03/2021 0830 EST QUEtiapine (SEROquel) tablet 25 mg 25 mg oral Given Ducey, K     08/03/2021 1050 EST lisinopril tablet 12.5 mg 12.5 mg oral Given Ducey, K     08/03/2021 1051 EST rosuvastatin (Crestor) tablet 40 mg 40 mg oral Given Ducey, K     08/03/2021 2122 EST QUEtiapine (SEROquel) tablet 25 mg 25 mg oral Given Melida Quitter, L     08/04/2021 0018 EST traZODone (Desyrel) tablet 50 mg 50 mg oral Given Melida Quitter, L     08/04/2021 0850 EST QUEtiapine (SEROquel) tablet 25 mg 25 mg oral Given Clelia Croft, D     08/04/2021 1040 EST lisinopril tablet 12.5 mg 12.5 mg oral Given Shelda Altes     08/04/2021 1040 EST rosuvastatin (Crestor) tablet 40 mg 40 mg oral Given Shaw, D        Diagnoses as of 08/04/21 1116   Agitation   Dementia, unspecified dementia severity, unspecified dementia type, unspecified whether behavioral, psychotic, or mood disturbance or anxiety (CMS/HCC)     Medical Decision Making  Agitation: acute illness or injury  Dementia, unspecified dementia severity, unspecified dementia type, unspecified whether  behavioral, psychotic, or mood disturbance or anxiety (CMS/HCC): complicated acute illness or injury  Amount and/or Complexity of Data Reviewed  Labs: ordered.  ECG/medicine tests: ordered.      Risk  Prescription drug management.        Patient accepted at [facility] in [place], Dr. Nelva Bush accepting     Carmell Austria, MD  08/04/21 1116

## 2021-07-30 NOTE — ED Progress Note (Signed)
PT was awaiting inpt psych bed for worsening dementia and mood issues. I am overnight MD and pt had a sleeping pill tonight and shortly afterwards approx 1:10am she was witnessed to ambulate across hall to stretcher sit on side and then when try to get up losy balance and fell onto buttocks, no head trauma or LOC, pt alert, was helped up on stretcher, she was smiling on my exam, said she felt "OK sweetheart", she had no complaints, EOMI  No face droop, moving arms and legs equally and comfortbly, no limb tenderness, heart RRR, lungs CTA, no chest wall or abd tenderness, no back tenderness, Vitals stable, she was helped into bed. Remains on fall precautions, psych hold. History and exam is not consistent with acute CVA/ruptured aneurism/venous thrombosis/meningitis/temporal arteritis/brain tumor/ICH/intracranial or neck dissection/pseudotumor cerebri/AVM.  No spinal or cord sxs, no sxs cardiac/PE/aortic issue, abd benign. History and exam not c/w AAA/isch bowel/appendicitis/cholecystitis or acute gallbladder issue/torsion/acute surgical abd issue/incarcerated hernia/perforation/SBO/mesenteric ischemia/acute vascular clot. No peritoneal signs. Pt was able to stand and walk with minimal assistance to bed OK.      Resa Miner, MD  08/04/21 0130

## 2021-07-30 NOTE — ED Notes (Signed)
Pt has increase agitation. Security at bedside with this RN. Unable to reorient, or redirect the patient. Pt keeps getting out of bed hitting the side rail. MD made aware.      Dasani Crear, RN  07/30/21 2235

## 2021-07-31 ENCOUNTER — Other Ambulatory Visit

## 2021-07-31 LAB — POCT GLUCOSE
POCT Glucose: 371 mg/dL — ABNORMAL HIGH (ref 70–110)
POCT Glucose: 226 mg/dL — ABNORMAL HIGH (ref 70–110)
POCT Glucose: 102 mg/dL (ref 70–110)

## 2021-07-31 MED ORDER — QUEtiapine (SEROquel) tablet 25 mg
25 | Freq: Two times a day (BID) | ORAL | Status: DC | PRN
Start: 2021-07-31 — End: 2021-08-04
  Administered 2021-08-01: 15:00:00 25 mg via ORAL

## 2021-07-31 MED ORDER — lisinopril tablet 12.5 mg
5 | Freq: Every day | ORAL | Status: DC
Start: 2021-07-31 — End: 2021-08-04
  Administered 2021-08-01 – 2021-08-04 (×4): 12.5 mg via ORAL

## 2021-07-31 MED ORDER — rosuvastatin (Crestor) tablet 40 mg
20 | Freq: Every day | ORAL | Status: DC
Start: 2021-07-31 — End: 2021-08-04
  Administered 2021-08-01 – 2021-08-04 (×4): 40 mg via ORAL

## 2021-07-31 MED ORDER — QUEtiapine (SEROquel) tablet 25 mg
25 | Freq: Three times a day (TID) | ORAL | Status: DC
Start: 2021-07-31 — End: 2021-07-31
  Administered 2021-07-31: 22:00:00 25 mg via ORAL

## 2021-07-31 MED ORDER — QUEtiapine (SEROquel) tablet 25 mg
25 | Freq: Two times a day (BID) | ORAL | Status: DC
Start: 2021-07-31 — End: 2021-08-04
  Administered 2021-08-01 – 2021-08-04 (×8): 25 mg via ORAL

## 2021-07-31 MED ORDER — traZODone (Desyrel) tablet 50 mg
50 | Freq: Three times a day (TID) | ORAL | Status: DC | PRN
Start: 2021-07-31 — End: 2021-08-04
  Administered 2021-08-04: 05:00:00 50 mg via ORAL

## 2021-07-31 MED ORDER — QUEtiapine (SEROquel) tablet 50 mg
25 | Freq: Two times a day (BID) | ORAL | Status: DC | PRN
Start: 2021-07-31 — End: 2021-08-04

## 2021-07-31 MED ORDER — traZODone (Desyrel) tablet 25 mg
50 | Freq: Three times a day (TID) | ORAL | Status: DC | PRN
Start: 2021-07-31 — End: 2021-08-04

## 2021-07-31 MED FILL — HALOPERIDOL LACTATE 5 MG/ML INJECTION SOLUTION: 5 5 mg/mL | INTRAMUSCULAR | Qty: 1

## 2021-07-31 MED FILL — HALOPERIDOL 1 MG TABLET: 1 1 mg | ORAL | Qty: 2

## 2021-07-31 MED FILL — QUETIAPINE 25 MG TABLET: 25 25 mg | ORAL | Qty: 1

## 2021-07-31 NOTE — ED Progress Note (Signed)
Emergency Psychiatry Service Admission Note    PATIENT NAME: Kim Blake  DATE OF BIRTH:@ 02-22-1946   MEDICAL RECORD #: [id number]  DATE OF ADMISSION: 07/30/2021     Chief Complaint:   Exacerbation of dementia symptoms    History of Present Illness:   Pt is a 76 year old widowed [place] American female who was brought to the ED by her daughter, Kim Blake, and son, Kim Blake due to concerns for her safety because of an increase in erratic behaviors, agitation, confusion, and disorientation.     This provider called pt's daughter, Kim Blake [phone], who states she found her unclothed outside the house and Kim Blake had to coax her back in to get dressed. Then she became aggressive, began throwing things at daughter, yelling, slamming doors and she decided to bring pt to ED for an evaluation. Pt can be very destructive at home, including ripping items, opening and closing doors so much that the frames are broken on four of them. She has frequent episodes of incontinence, and walks outside in the woods and swamp in the back yard, then comes back with one or both shoes missing. Pt has not been taking care of her ADLs and does not want to shower. Daughter is getting conservatorship of pt, and PCP is helping to facilitate this, so that she can sign her into a memory care facility. She was diagnosed with dementia four years ago, but Kim Blake thinks she has had it for a much longer time frame and behaviors have been getting worse over the past year or so and Kim Blake states she cannot safely care for pt in her home anymore.     When this Clinical research associate met with pt, she was sitting in her room with a partially eaten lunch next to her. She repeatedly complained about the food on her tray, opening different containers stating "how awful" it was. She was unable to state the month, time of day or what hospital or town she was currently in. Pt was able to state her daughter's and son's names. When asked about what brought her into the hospital and if she was  walking outside with no clothing on, she laughed and thought the question was funny.    PAST PSYCHIATRIC HISTORY:  --Past Psychiatric Admissions: None.  --History of Suicidality and/or Self-Harm: None.  --History of Violence: Pt can be verbally and physically aggressive due to dementia.    SUBSTANCE USE HISTORY: None reported or noted in chart    OUTPATIENT TREATERS:   --Psychiatric Prescriber: None.  --Psychotherapist: None.  --Primary Care Physician: Dr. Richardean Sale [phone]  --Other: Daughter, Kim Blake, [phone], pt's power of attorney and health care proxy    OUTPATIENT MEDICATIONS:   Prior to Admission medications    Not on File        PAST MEDICAL HISTORY:  HTN, hyperlipidemia    ALLERGIES: Patient has no known allergies.     SOCIAL/LEGAL HISTORY:   Pt lives with her 4 y.o. granddaughter,  adult daughter and her husband. They moved into pt's home to care for her. She is a widow.    MENTAL STATUS EXAMINATION  Appearance/Behavior: Disheveled. Unkempt. Poor eye contact. Playing with food on her tray.    Speech: Nonsensical, delayed, normal volume, garbled at times.   Mood/Affect:  Anxious mood flat affect.  Thought Process: Disorganized, tangential.  Thought Content: Unable to assess.   Orientation/Cognition: Alert and oriented to person only. Cognition, memory and attention span are poor.     Insight:  Grossly impaired.  Judgment: Grossly impaired.    Risk Factors: No outpatient prescriber, recent diagnosis of dementia, frequent agitation  Protective Factors: Daughter and son, PCP      RELEVANT LABS:   Pain Management Panel     Pain Management Panel Latest Ref Rng & Units 07/30/2021 12/10/2020    CREATININE, URINE >=20.00 mg/dL 16.10 960.45    AMPHETAMINE SCRN UR Screen Negative Screen Negative Screen Negative    BARBITURATE SCRN UR Screen Negative Screen Negative Screen Negative    BENZODIAZEPINE SCRN UR Screen Negative Screen Negative Screen Negative    BUPRENORPHINE SCRN, UR Screen Negative Screen Negative  Screen Negative    COCAINE SCRN, UR Screen Negative Screen Negative Screen Negative    CANNABINOID SCRN, UR Screen Negative Screen Negative Screen Negative    FENTANYL SCRN, UR Screen Negative Screen Negative Screen Negative    OPIATE SCRN UR Screen Negative Screen Negative Screen Negative    METHADONE SCRN UR Screen Negative Screen Negative Screen Negative    OXYCODONE SCRN UR Screen Negative Screen Negative Screen Negative             IMPRESSION:   Pt is a 76 year old widowed female who was brought to the ED by her adult children due to increased aggression, and erratic behaviors from dementia. Daughter, whom pt lives with, does not think she can manage her safely at home anymore due to her wandering and agitation. She is working on getting guardianship of pt, so that she can sign her into a memory care facility and be able to address her needs appropriately. Discussed adding low dose Seroquel for agitation, and daughter, who is health care proxy, is agreeable to this.    DSM-5 DIAGNOSES:  Unspecified dementia    RECOMMENDATION & PLAN:    Medications:  Seroquel 25 mg po bid ordered and will increase as pt is able to tolerate  Seroquel 25 mg po bid prn ordered for for mild agitation  Seroquel 50 mg po bid prn ordered for moderate to severe agitation  Trazodone 12.5 mg po bid prn ordered for mild agitation  Trazodone 25 mg po bid prn ordered for moderate to severe agitation    Information gathering:  Hospital social services to contact patient collaterals as allowed for additional information as needed and for purposes of assisting in transitioning patient to [facility] placement.    Disposition/Discharge plan:   Collaborate with hospital ESP team for patient admission to an [facility] for safety and stabilization of symptoms.  Facilitate when appropriate for transfer to [facility] placement.    I have spent 45  minutes in this clinical encounter and greater than 50% was spent in counseling and coordinating care.                  Era Bumpers, NP  07/31/21 [phone]

## 2021-07-31 NOTE — ED Notes (Signed)
Patient lying in bed with eyes closed. Resp even and unlabored. No behavioral problems noted at this time.     Gery PrayEdward Lycan Davee, RN  07/31/21 [phone]

## 2021-07-31 NOTE — ED Notes (Signed)
Assumed care of patient from off going RN, labs and plan of care reviewed.  Currently patients color and work of breathing WDL.  No current needs stated by patient at this time. Patient awake and alert, sitting up in room in greens.        Birdie Riddle, RN  07/31/21 [phone]

## 2021-07-31 NOTE — ED Notes (Signed)
Received patient from off going nurse. Patient resting. Resp even and unlabored. No behavioral problems noted at this time. Remains on Observation.     Gery Pray, RN  07/31/21 [phone]

## 2021-07-31 NOTE — Nursing Note (Signed)
Assumed care of pt at 1500. Pt resting comfortably in the stretcher. No complaints.

## 2021-07-31 NOTE — ED Notes (Signed)
Patient lying in bed. Resp even and unlabored. Skin warm and dry to touch. No behavior problems noted at this time.     Gery Pray, RN  07/31/21 [phone]

## 2021-07-31 NOTE — ED Notes (Signed)
Patient lying in bed with eyes closed. No behavioral problems noted at this time.     Gery PrayEdward Nickie Warwick, RN  07/31/21 [phone]

## 2021-07-31 NOTE — ED Progress Note (Signed)
Faxed this case to [facility] [place] and [place] [facility] at [employer].

## 2021-07-31 NOTE — ED Notes (Signed)
Pt resting comfortably. Sleeping laying supine on the stretcher. RR even and regular     Ekin Pilar, RN  07/31/21 0036

## 2021-07-31 NOTE — ED Notes (Signed)
Pt continues to rest quietly in her bed.  She is pleasant and agreeable during interactions with this Clinical research associate as well as other staff.      Ginette Otto, RN  07/31/21 [phone]

## 2021-08-01 ENCOUNTER — Encounter (HOSPITAL_BASED_OUTPATIENT_CLINIC_OR_DEPARTMENT_OTHER): Admitting: Emergency Medicine

## 2021-08-01 MED FILL — QUETIAPINE 25 MG TABLET: 25 25 mg | ORAL | Qty: 1

## 2021-08-01 MED FILL — LISINOPRIL 5 MG TABLET: 5 5 mg | ORAL | Qty: 3

## 2021-08-01 MED FILL — ROSUVASTATIN 20 MG TABLET: 20 20 mg | ORAL | Qty: 2

## 2021-08-01 NOTE — ED Notes (Signed)
Assumed care of patient @ 1900. Patient awake and alert, calm and cooperative. Patient remains on S12/BDS, mod risk. No issues reported by prior RN. RSIG at bedside for monitoring.     Trevor Iha, RN  08/01/21 2038

## 2021-08-01 NOTE — ED Notes (Signed)
Patient has been sitting in doorway, refuses to lie down in bed. RSIG continues to monitor patient.     Trevor Iha, RN  08/01/21 2239

## 2021-08-01 NOTE — ED Notes (Signed)
Pt sitting on chair in door way t/o the morning. Calm and cooperative. Taking meds without difficulty. Eating small portions of each meal     Tama Gander, RN  08/01/21 1247

## 2021-08-01 NOTE — ED Notes (Signed)
Pt sleeping in stretcher. Steady chest rise and fall. PWD. RSIG seated at doorway and able to visualize pt AAt.     Tama GanderEvan Jalen Daluz, RN  08/01/21 [phone]

## 2021-08-02 MED FILL — ROSUVASTATIN 20 MG TABLET: 20 20 mg | ORAL | Qty: 2

## 2021-08-02 MED FILL — LISINOPRIL 5 MG TABLET: 5 5 mg | ORAL | Qty: 3

## 2021-08-02 MED FILL — QUETIAPINE 25 MG TABLET: 25 25 mg | ORAL | Qty: 1

## 2021-08-02 NOTE — ED Notes (Signed)
Pt asleep on stretcher.... will continue to monitor.     Epimenio SarinLinda Kamilia Carollo, RN  08/02/21 [phone]

## 2021-08-02 NOTE — ED Notes (Signed)
Assumed care of patient from off going RN, labs and plan of care reviewed.  Currently patients color and work of breathing WDL.  No current needs stated by patient at this time. Patient seated in bedside chair in greens, awaiting breakfast.        Birdie Riddle, RN  08/02/21 [phone]

## 2021-08-02 NOTE — ED Notes (Signed)
Assumed care from [place], [place].   Pt laying on stretcher with eyes closed.  Remains on section 12 bed search.  Moderate Risk level.   RSIG monitoring patient.     Epimenio Sarin, RN  08/02/21 2019

## 2021-08-02 NOTE — ED Notes (Signed)
Easily awoken for HS med, flat affect.  Cooperative.   VSS.        Epimenio SarinLinda Karrington Studnicka, RN  08/02/21 2053

## 2021-08-02 NOTE — ED Notes (Signed)
This Clinical research associate spoke with patients Daughter [name] and gave update regarding plan of care.      Birdie Riddle, RN  08/02/21 1419

## 2021-08-03 MED FILL — QUETIAPINE 25 MG TABLET: 25 25 mg | ORAL | Qty: 1

## 2021-08-03 MED FILL — LISINOPRIL 5 MG TABLET: 5 5 mg | ORAL | Qty: 3

## 2021-08-03 MED FILL — ROSUVASTATIN 20 MG TABLET: 20 20 mg | ORAL | Qty: 2

## 2021-08-03 NOTE — ED Notes (Signed)
Pt still sleeping, no distress.     Epimenio Sarin, RN  08/03/21 [phone]

## 2021-08-03 NOTE — ED Progress Note (Signed)
[facility] was willing to take patient as long as she had a place to go to after treatment. I spoke to the Health Care Proxy [name] and the family will not take the mother home. She reports that they are looking into a Long Care Unit. [facility] will take patient after her disposition is resolved to where she would be going after discharge from [facility]     [place], [facility]  08/03/21 1250

## 2021-08-03 NOTE — ED Notes (Signed)
Pt up to BR.... and returns back to her bed, no distress.   Easily directed.     Epimenio Sarin, RN  08/03/21 [phone]

## 2021-08-03 NOTE — ED Notes (Signed)
Pt moved to room 7...Marland Kitchen.Marland Kitchen. placed in a hospital bed for comfort.   Will continue to monitor.     Epimenio SarinLinda Heberto Sturdevant, RN  08/03/21 2218

## 2021-08-03 NOTE — ED Progress Note (Shared)
Navigator spo

## 2021-08-03 NOTE — ED Notes (Signed)
Slept almost whole night......quiet.   No voiced c/o's.    Will continue to monitor.     Epimenio Sarin, RN  08/03/21 [phone]

## 2021-08-03 NOTE — Nursing Note (Signed)
Pt observed sleeping on st..... no distress.

## 2021-08-03 NOTE — ED Progress Note (Signed)
Navigator spoke with patient's daughter [phone]) regarding d/c planning. Daughter reports that family is looking into possible LTP options for patient as dementia has been getting worse. Navigator provided [facility] options for going back into community for services, but could also assist with care-giving options as well. Navigator educated daughter on individual financial applications for LTP and provided contact number for any further questions.      Nilda Riggs, LCSW  08/03/21 1224

## 2021-08-03 NOTE — ED Notes (Signed)
Pt resting on hall stretcher.... easily to redirect.    Remains on a section 12 bed search.       Epimenio Sarin, RN  08/03/21 2121

## 2021-08-04 LAB — SARS/FLU/RSV
Influenza A RNA: NOT DETECTED
Influenza B RNA: NOT DETECTED
Respiratory syncytial virus: NOT DETECTED
SARS-CoV-2 RNA PCR: NOT DETECTED

## 2021-08-04 LAB — POCT GLUCOSE: POCT Glucose: 119 mg/dL — ABNORMAL HIGH (ref 70–110)

## 2021-08-04 MED FILL — QUETIAPINE 25 MG TABLET: 25 25 mg | ORAL | Qty: 1

## 2021-08-04 MED FILL — ROSUVASTATIN 20 MG TABLET: 20 20 mg | ORAL | Qty: 2

## 2021-08-04 MED FILL — TRAZODONE 50 MG TABLET: 50 50 mg | ORAL | Qty: 1

## 2021-08-04 MED FILL — LISINOPRIL 5 MG TABLET: 5 5 mg | ORAL | Qty: 3

## 2021-08-04 NOTE — ED Progress Note (Signed)
Family was able to find a nursing home for after placement; therefore [facility] will be able to take patient. Accepting Dr Daine Floras Covid ordered. Pick up time 11:00 am Deb RN at the [facility] core notified     Keith Rake  08/04/21 1610

## 2021-08-04 NOTE — ED Progress Note (Signed)
Nurse to nurse will be after new Covid comes back; before the pick up at 11:00 am Phone number [phone]     Kim RakeCANNEY, Kim Blake  08/04/21 [phone]

## 2021-08-04 NOTE — ED Notes (Signed)
Pt got out of hospital bed (had 2 side rails up) and walked over to another stretcher and then fell on buttocks.... did not hit head per the [facility] that was observing her.   VSS...... MD examined pt and pivoted back to bed with assistance.   Side rails and bed alarm in place at this time.     Epimenio Sarin, RN  08/04/21 [phone]

## 2021-08-04 NOTE — ED Progress Note (Signed)
Navigator spoke with patient's daughter [phone]) to confirm that patient will be transferred to [facility] hospital around 11:00 a.m. after a negative COVID swab. Navigator provided daughter with [employer] Legal [employer] info for any legality questions around long term placement.      Nilda Riggs, LCSW  08/04/21 1015

## 2021-08-04 NOTE — ED Notes (Addendum)
Pt confused at baseline, pt disorganized, pleasant and cooperative. Denies SI/HI and no A/V/H. Pt accepted to [facility] for 1100 pickup  Report given to [facility] [address]            Tana Conch, [place]  08/04/21 1011       Tana Conch, RN  08/04/21 1102

## 2021-08-04 NOTE — ED Notes (Signed)
Pt standing in doorway to room 7.... hasnt laid down to sleep yet....Marland KitchenMarland KitchenWill medicate with trazodone for restlessness and sleep.     Epimenio Sarin, RN  08/04/21 [phone]

## 2021-08-04 NOTE — ED Notes (Signed)
Sleeping soundly in bed...Marland KitchenMarland KitchenMarland Kitchenwill continue to monitor.     Epimenio Sarin, RN  08/04/21 [phone]

## 2021-08-04 NOTE — Nursing Note (Signed)
Still sleeping, no distress.   VSS.

## 2021-09-30 ENCOUNTER — Ambulatory Visit: Payer: MEDICARE

## 2022-09-04 ENCOUNTER — Inpatient Hospital Stay
Admit: 2022-09-04 | Discharge: 2022-09-04 | Disposition: A | Discharge status: Home / Self Care | Payer: MEDICARE | Attending: Emergency Medicine

## 2022-09-04 DIAGNOSIS — J101 Influenza due to other identified influenza virus with other respiratory manifestations: Secondary | ICD-10-CM

## 2022-09-04 LAB — SARS/FLU/RSV
Influenza A RNA: DETECTED — AB
Influenza B RNA: NOT DETECTED
Respiratory syncytial virus: NOT DETECTED
SARS-CoV-2 RNA PCR: NOT DETECTED

## 2022-09-04 NOTE — Other (Signed)
Patient Education  Table of Contents   Influenza, adultos (Influenza, Adult)    To view videos and all your education online visit,  https://pe.elsevier.com/[email or web address]  or scan this QR code with your smartphone.  Access to this content will expire in one year.  Influenza, adultos  Influenza, Adult  A influenza, tamb?m chamada de "gripe",  uma infec?o viral que afeta principalmente o trato respirat?rio. Isso inclui os pulm?es, o nariz e a garganta. A gripe  transmitida facilmente de pessoa para pessoa ( contagiosa). Ela causa sintomas de um resfriado comum, al?m de febre alta e dores no corpo.  Quais s?o as causas?  Esse quadro cl?nico  causado pelo v?rus influenza. Voc pode pegar este v?rus:   Ao respirar pequenas got?culas no ar que uma pessoa contaminada tossiu ou espirrou.   Tocando em alguma coisa que cont?m v?rus (foi contaminada) e depois tocando a boca, o nariz ou os olhos.  O que aumenta o risco?  Os fatores de risco a Engineer, technical sales probabilidade de pegar gripe:   N?o lavar ou higienizar as m?os com frequ?ncia.   Ter contato pr?ximo com muitas pessoas durante a esta?o de resfriados e gripes.   Tocar na boca, olhos ou nariz sem antes lavar ou desinfetar as m?os.   N?o tomar a vacina anual contra gripe.  Voc pode ter mais risco de pegar gripe, incluindo algumas de suas complica?es graves, como infec?o pulmonar (pneumonia) se voc?:   Tiver mais de 65 anos.   Estiver gr?vida.   Estiver com o sistema de defesa contra doen?as (sistema imune) enfraquecido. Isso inclui pessoas com HIV ou AIDS, que estejam fazendo quimioterapia ou tomando medicamentos que reduzem (suprimem) o sistema imune.   Tiver uma doen?a de longo prazo (cr?nica), tal como doen?a card?aca, doen?a renal, diabetes ou doen?a pulmonar.   Tiver um dist?rbio do f?gado.   Estiver extremamente acima do peso (obesidade m?rbida).   Tiver anemia.   Tiver asma.  Quais s?o os sinais ou sintomas?  Os sintomas desse quadro normalmente  come?am de repente e duram 4?14 dias. Isso pode incluir:   Febre e calafrios.   Dor de cabe?a, dores no corpo ou dores musculares.   Dor de garganta.   Tosse.   Corrimento ou entupimento (congest?o) nasal.   Desconforto no peito.   Pouco apetite.   Kim Blake ou fadiga.   Tontura.   Enjoo ou v?mito.  Como esse quadro cl?nico  diagnosticado?  Esse quadro cl?nico pode ser diagnosticado com base em:   Seus sintomas e hist?rico m?dico.   Um exame f?sico.    Coleta de um esfrega?o do seu nariz ou garganta e exame da secre?o quanto  presen?a do v?rus influenza.  Como esse quadro cl?nico  tratado?  Se a gripe for diagnosticada precocemente, voc pode ser tratado com medicamento antiviral administrado pela boca (por via oral) ou por via intravenosa (IV). Isso pode ajudar a reduzir a Barista e a dura?o da doen?a.  Cuidar de si mesmo em casa tamb?m pode ajudar a aliviar os sintomas. Seu m?dico poder recomendar que voc?:   Tome medicamentos que n?o precisam de Audiological scientist.   Beba bastante l?quido.  Em muitos casos, a gripe passa sozinha. Se voc tiver sintomas ou complica?es graves, voc pode receber tratamento [employer] hospital.  Siga estas instru?es em casa:  Atividades   Repouse tanto quanto necess?rio e durma bastante.   Fique em casa e n?o v trabalhar ou estudar, de acordo com as orienta?es do seu m?dico. A  menos que seja para uma consulta m?dica, procure evitar sair de casa at 24 horas depois de sua febre ter passado e de voc ter interrompido o uso de medicamentos.  [place] reidrata?o oral (SRO). Essa  uma bebida vendida em farm?cias e lojas de varejo.   Beba l?quidos em quantidade suficiente para manter a urina na cor amarelo-p?lida.   Beba l?quidos transparentes em pequenas quantidades como for capaz. L?quidos claros incluem ?gua, peda?os de gelo, suco de fruta misturados com ?gua e bebidas esportivas de baixa caloria.   Coma alimentos leves e de f?cil digest?o em pequenas  quantidades como for capaz. Esses alimentos incluem bananas, pur de [place]?, arroz, carnes magras, torradas e bolachas.   Evite tomar l?quidos que contenham muito a?car ou cafe?na, como bebidas energ?ticas, bebidas esportivas n?o diet?ticas e refrigerantes.   Evite bebidas alco?licas.   Evite alimentos temperados ou gordurosos.  Instru?es gerais       Tome medicamentos vendidos com ou sem receita m?dica somente de acordo com as indica?es do seu m?dico.   Use um umidificador de n?voa ?mida para acrescentar umidade ao ar da sua casa. Isso pode facilitar a respira?o.  ? Ao usar um umidificador de ar frio, limpe-o diariamente. Jogue a ?gua fora e troque-a por ?gua limpa.   Reunion a boca e o nariz ao tossir ou Haematologist.   Lave as m?os com ?gua e sab?o frequentemente e durante pelo menos 20 segundos, principalmente depois de tossir Research scientist (physical sciences). Caso ?gua e sab?o n?o estejam dispon?veis, use ?lcool gel antiss?ptico para as m?os.   Compare?a a todas as consultas de acompanhamento. Isso  importante.  Como esse quadro cl?nico pode ser prevenido?     Tome a vacina contra a gripe anualmente. Geralmente, ela est dispon?vel no final do ver?o, outono ou inverno. Pergunte ao seu m?dico quando voc deve receber a Editor, commissioning.   Evite contato com pessoas doentes durante a temporada de gripes e resfriados. Em geral, essa temporada ocorre no outono e inverno.  Entre em contato com um m?dico se:   Apresentar novos sintomas.   Voc apresentar:  ? Dor no peito.  ? Diarreia.  ? Febre.   Sua tosse piorar.   Produzir mais muco.   Sentir n?useas ou vomitar.  Busque ajuda imediatamente se voc?:   Sentir falta de ar ou tiver dificuldade para respirar.   Sua pele ou unhas ficarem azuladas.   Tiver dor intensa ou rigidez no pesco?o.   Sentir dor de cabe?a s?bita ou dor s?bita no rosto ou ouvido.   N?o conseguir comer ou beber sem vomitar.  Esses sintomas podem representar um problema s?rio e ser uma emerg?ncia. N?o espere para ver se  os sintomas desaparecem. Procure um m?dico imediatamente. Ligue para o n?mero de emerg?ncia local (911, nos EUA). N?o dirija por conta pr?pria at o hospital.  Resumo   A influenza, tamb?m chamada de "gripe",  uma infec?o viral que afeta basicamente o trato respirat?rio.   Os sintomas da gripe geralmente come?am de repente e duram de 1?61 dias.   Tomar a vacina anual contra gripe  a melhor forma de Teacher, early years/pre.   Fique em casa e n?o v trabalhar ou estudar, de acordo com as orienta?es do seu m?dico. A menos que seja para uma consulta m?dica, procure evitar sair de casa at 24 horas depois de sua febre ter passado e de voc ter interrompido o uso de medicamentos.   Compare?a a  todas as consultas de acompanhamento. Isso  importante.  Estas informa?es n?o se destinam a substituir as recomenda?es de seu m?dico. N?o deixe de discutir quaisquer d?vidas com seu m?dico.  Document Released: 2005-05-17 Document Updated: 2020-03-06 Document Reviewed: 2020-03-06  Elsevier Patient Education ? [place].

## 2022-09-04 NOTE — ED Provider Notes (Signed)
HPI   No chief complaint on file.      77 y/o woman with HTN, HLD, and dementia presenting with several days of lethargy and dry cough.  It is not clear when exactly her symptom started.  Her roommate at the memory care facility recently tested positive for influenza.  The patient denies any acute complaints at present.      History provided by:  Patient, medical records and relative  History limited by:  Dementia                No data recorded                                Patient History   No past medical history on file.  No past surgical history on file.  No family history on file.  Social History     Tobacco Use   . Smoking status: Never   . Smokeless tobacco: Never   Substance Use Topics   . Alcohol use: Not on file   . Drug use: Not on file       Review of Systems   Review of Systems   Unable to perform ROS: Dementia   Constitutional: Positive for activity change. Negative for appetite change, chills and fever.   Respiratory: Positive for cough. Negative for shortness of breath.    Cardiovascular: Negative for chest pain.   Gastrointestinal: Negative for abdominal pain, diarrhea, nausea and vomiting.       Physical Exam   ED Triage Vitals [09/04/22 1025]   Temp Pulse Resp BP   36.7 C (98.1 F) 67 13 111/56      SpO2 Temp Source Heart Rate Source Patient Position   95 % Oral Monitor Sitting      BP Location FiO2 (%)     Right arm --       Physical Exam  Vitals and nursing note reviewed.   Constitutional:       General: She is not in acute distress.     Appearance: Normal appearance.   HENT:      Head: Normocephalic and atraumatic.      Mouth/Throat:      Mouth: Mucous membranes are moist.   Eyes:      Conjunctiva/sclera: Conjunctivae normal.      Pupils: Pupils are equal, round, and reactive to light.   Cardiovascular:      Rate and Rhythm: Normal rate and regular rhythm.      Pulses: Normal pulses.      Heart sounds: No murmur heard.  Pulmonary:      Effort: Pulmonary effort is normal.      Breath sounds:  Normal breath sounds.   Abdominal:      General: There is no distension.      Palpations: Abdomen is soft.      Tenderness: There is no abdominal tenderness.   Musculoskeletal:         General: No tenderness or deformity.      Cervical back: Neck supple.   Skin:     General: Skin is warm and dry.      Findings: No rash.   Neurological:      Mental Status: She is alert. Mental status is at baseline.   Psychiatric:         Mood and Affect: Mood normal.         Behavior: Behavior normal.  No orders to display       Labs Reviewed   SARS/FLU/RSV - Abnormal       Result Value    SARS-CoV-2 RNA PCR Not Detected      Influenza A RNA Detected (*)     Influenza B RNA Not Detected      Respiratory syncytial virus Not Detected         ED Course & MDM   Diagnoses as of 09/04/22 1220   Influenza A       Medical Decision Making  77 y/o woman with HTN, HLD, and dementia presenting with several days of lethargy and dry cough.  Here, she is awake and alert (at her baseline), afebrile, and with stable vital signs.  Lungs are clear to auscultation with good aeration and no focal adventitious sounds.  The rest of her exam is grossly unremarkable.  As the exact onset of her symptoms is unclear and she does not have any serious cardiac or respiratory pathology at baseline, treatment with oseltamivir is not indicated.  I discussed this with the patient and her daughter at the bedside.  I am discharging her back to her memory care facility.  Outpatient follow-up encouraged and return precautions discussed.    Influenza A: acute illness or injury  Amount and/or Complexity of Data Reviewed  Independent Historian:      Details: Daughter/conservator  Labs: ordered. Decision-making details documented in ED Course.      Risk  Decision regarding hospitalization.                Murlean Caller, MD  09/04/22 [phone]

## 2022-09-04 NOTE — Discharge Instructions (Signed)
You were seen in the emergency department because you are not acting like yourself and were coughing.  You have tested positive for influenza A ("the flu").  Since it does not certain when exactly your symptoms started and you are not having any serious problems, you do not need to be prescribed an antiviral medicine.    Be sure to drink extra fluids and get extra rest over the next few days.  You can follow up with your regular doctor as needed.    If your condition worsens or if you develop new symptoms that are concerning to you, please come back to the emergency department.      Thank you for choosing [facility] for your care!

## 2022-09-04 NOTE — ED Triage Notes (Signed)
[facility] from [facility] states pts roommate has flu states staff wants her checked they also states increase lethargy pt portuguese speaking only

## 2023-05-30 ENCOUNTER — Emergency Department: Admit: 2023-05-30 | Payer: MEDICARE

## 2023-05-30 ENCOUNTER — Inpatient Hospital Stay
Admit: 2023-05-30 | Discharge: 2023-05-30 | Disposition: A | Discharge status: Home / Self Care | Payer: MEDICARE | Attending: Emergency Medicine

## 2023-05-30 DIAGNOSIS — Z9181 History of falling: Secondary | ICD-10-CM

## 2023-05-30 DIAGNOSIS — S0990XA Unspecified injury of head, initial encounter: Secondary | ICD-10-CM

## 2023-05-30 LAB — TROPONIN I, HIGH SENSITIVITY: Troponin I, High Sensitivity: 6 ng/L

## 2023-05-30 LAB — CBC WITH DIFFERENTIAL
Basophils %: 0.5 %
Basophils Absolute: 0.04 10*3/uL (ref 0.00–0.22)
Eosinophils %: 0.8 %
Eosinophils Absolute: 0.06 10*3/uL (ref 0.00–0.50)
Hematocrit: 36.1 % (ref 32.0–47.0)
Hemoglobin: 11.9 g/dL (ref 11.0–16.0)
Immature Granulocytes %: 0.4 %
Immature Granulocytes Absolute: 0.03 10*3/uL (ref 0.00–0.10)
Lymphocyte %: 17.7 %
Lymphocytes Absolute: 1.34 10*3/uL (ref 0.70–4.00)
MCH: 31.4 pg (ref 26.0–34.0)
MCHC: 33 g/dL (ref 31.0–37.0)
MCV: 95.3 fL (ref 80.0–100.0)
MPV: 8.8 fL — ABNORMAL LOW (ref 9.1–12.4)
Monocytes %: 8.7 %
Monocytes Absolute: 0.66 10*3/uL (ref 0.36–0.77)
NRBC %: 0 % (ref 0.0–0.0)
NRBC Absolute: 0 10*3/uL (ref 0.00–2.00)
Neutrophil %: 71.9 %
Neutrophils Absolute: 5.43 10*3/uL (ref 1.50–7.95)
Platelets: 204 10*3/uL (ref 150–400)
RBC: 3.79 M/uL (ref 3.70–5.20)
RDW-CV: 12.7 % (ref 11.5–14.5)
RDW-SD: 44.1 fL (ref 35.0–51.0)
WBC: 7.6 10*3/uL (ref 4.0–11.0)

## 2023-05-30 LAB — CK: CK: 96 U/L (ref 30.0–200.0)

## 2023-05-30 LAB — SARS-COV-2 AND INFLUENZA A/B RNA BY PCR
Influenza A RNA: NOT DETECTED
Influenza B RNA: NOT DETECTED
SARS-CoV-2 RNA PCR: DETECTED — AB

## 2023-05-30 LAB — COMPREHENSIVE METABOLIC PANEL
ALT: 22 U/L (ref 0–55)
AST: 19 U/L (ref 6–42)
Albumin: 3.5 g/dL (ref 3.2–5.0)
Alkaline phosphatase: 117 U/L (ref 30–130)
Anion Gap: 6 mmol/L (ref 3–14)
BUN: 19 mg/dL (ref 6–24)
Bilirubin, total: 0.4 mg/dL (ref 0.2–1.2)
CO2 (Bicarbonate): 26 mmol/L (ref 20–32)
Calcium: 9 mg/dL (ref 8.5–10.5)
Chloride: 112 mmol/L — ABNORMAL HIGH (ref 98–110)
Creatinine: 1.48 mg/dL — ABNORMAL HIGH (ref 0.55–1.30)
Glucose: 94 mg/dL (ref 70–110)
Potassium: 4.3 mmol/L (ref 3.6–5.2)
Protein, total: 7.2 g/dL (ref 6.0–8.4)
Sodium: 144 mmol/L (ref 135–146)
eGFRcr: 36 mL/min/{1.73_m2} — ABNORMAL LOW (ref >=60)

## 2023-05-30 LAB — LIGHT BLUE TOP

## 2023-05-30 LAB — RAINBOW DRAW SST GOLD TOP

## 2023-05-30 MED ORDER — nirmatrelvir-ritonavir, renal dosing, (Paxlovid) 150-100 mg tablets,dose pack dose pack
150-100 | ORAL_TABLET | Freq: Two times a day (BID) | ORAL | 0 refills | Status: DC
Start: 2023-05-30 — End: 2023-06-14

## 2023-05-30 MED ORDER — acetaminophen (Tylenol) tablet 650 mg
325 | Freq: Once | ORAL | Status: AC
Start: 2023-05-30 — End: 2023-05-30
  Administered 2023-05-30: 18:00:00 650 mg via ORAL

## 2023-05-30 MED ORDER — nirmatrelvir-ritonavir, renal dosing, (Paxlovid) 150-100 mg tablets,dose pack dose pack
150-100 | ORAL_TABLET | Freq: Two times a day (BID) | ORAL | 0 refills | Status: DC
Start: 2023-05-30 — End: 2023-05-30

## 2023-05-30 MED ORDER — sodium chloride 0.9 % bolus 500 mL
0.9 | Freq: Once | INTRAVENOUS | Status: AC
Start: 2023-05-30 — End: 2023-05-30
  Administered 2023-05-30: 14:00:00 500 mL via INTRAVENOUS

## 2023-05-30 MED FILL — ACETAMINOPHEN 325 MG TABLET: 325 325 mg | ORAL | Qty: 2

## 2023-05-30 NOTE — Progress Notes (Addendum)
05/30/23 1207   Case Management Initial Assessment   Patient Discharged Before Able to Interview/Assess No   Source of Information Family   Type of Residence Assisted Living  ([facility])   Discharge Services Anticipated at this Time Yes   Expected Discharge Needs VNA   Patient Information   Interpreter Needs Language (freetext)  ([place])   Support Systems Children/Family;Other (comment)  (ALF staff)   Need PCP on Discharge No   Current Functional Status   Bathing Independent   Toileting Independent   Walking in Home Independent   Assistive Device Used None   Community Mobility Minimal Assist   IADL Assistance Needed Yes   Financial Information   Insurance Confirmed with Patient Yes   Financial Concerns in Last 60 Days Admit/Readmission No   Financial Coordination Notified Not Needed   Legal Information   Does Patient Have HCP Yes   Discharge Planning   Patient/Family/Caregiver Discharge Preference Home w/ Services   Anticipated Discharge Disposition Home w/ VNA     Case Management Progress Note:   A 77 yo female who presented in the ED after a fall. Met with patient and daughter (who provided most of the info). Patient resides at the ALF ([facility]. Patient walks with no device. PTeval was done with daughter watching how patient ambulated. Daughter reported that patient is at baseline walking. VM left with the wellness nurse of [facility] to find out if they have a contracted home PT provider, awaiting return call. COC following.  Angela Burke, RN  05/30/23  12:13 PM    Addendum:  Follow-up call made to Amy, wellness nurse of [facility]. The facility uses [facility] for home PT/OT. She requested if Covid test can be done before she returns to ALF because of 1-2 new cases at the ALF. Request was relayed to MD and RN. Homecare referral sent to [facility] thru careport.  Angela Burke, RN  05/30/23  12:21 PM    Addendum:  Patient tested positive for Covid. Per Misty Stanley, wellness nurse of [facility],  patient can still return to the ALF. MD and RN were updated.  Angela Burke, RN  05/20/23  1:35 PM

## 2023-05-30 NOTE — ED Triage Notes (Signed)
77 year old female from [facility] . Patient was an unwitnessed fall in the bathroom. Comes in by EMS with cervical collar intact. Hx dementia and portuguese speaking patient. [place].

## 2023-05-30 NOTE — ED Notes (Signed)
This rn called [facility] [place] and spoke with Susa Raring. Okay with pt returning.      Gabriela Eves, RN  05/30/23 1346

## 2023-05-30 NOTE — Other (Signed)
Patient Education  Table of Contents   Preven?o de quedas em casa, adulto (Fall Prevention in the Home, Adult)   Head Injury, Adult    To view videos and all your education online visit,  https://pe.elsevier.com/mIj3s4v4  or scan this QR code with your smartphone.  Access to this content will expire in one year.  Preven?o de quedas em casa, adulto  Fall Prevention in the Home, Adult  Quedas podem causar les?es e afetar pessoas de qualquer idade. H muitas coisas simples que voc pode fazer para tornar sua casa segura e ajudar a prevenir quedas.  Se precisar, pe?a ajuda para implementar essas altera?es.  Quais medidas posso tomar para evitar quedas?  Informa?es gerais   Providencie boa ilumina?o em todas as Aeronautical engineer. Certifique-se de fazer o seguinte:  ? Substitua l?mpadas queimadas.  ? Acenda as luzes se estiver escuro e use lumin?rias noturnas.   Mantenha os itens que voc [place] com frequ?ncia em lugares de f?cil alcance. Abaixe a [employer], se necess?rio.   Mude a disposi?o dos m?veis de forma que os caminhos fiquem desobstru?dos.   N?o deixe tapetes ou outros objetos pelo ch?o que possam fazer voc trope?ar.   Se alguma parte do piso estiver irregular, conserte.   Passe tinta ou cole uma fita colorida ou de contraste para identificar claramente e ajud?-lo a ver:  ? Barras de apoio ou corrim?os.  ? Primeiro e ?ltimo degraus de escadas.  ? A borda de cada degrau.   Se voc usar uma escada dobr?vel:  ? Confira se est totalmente aberta. N?o suba em uma escada fechada.  ? Confira se as Freight forwarder escada est?o travadas corretamente.  ? Pe?a para algu?m segurar a escada enquanto voc a Public affairs consultant.   Esteja ciente de onde est?o seus animais de estima?o ao andar pela casa.  O que posso fazer no banheiro?       Mantenha o ch?o seco. Se o ch?o molhar, seque imediatamente.   Remova o ac?mulo de sabonete na banheira ou no chuveiro periodicamente. O ac?mulo deixa as banheiras e  chuveiros escorregadios.   Use tapetes ou faixas antiderrapantes no assoalho da banheira ou [employer].   Cole os tapetes de banheiro com seguran?a usando fita adesiva dupla-face para tapete.   Caso precise se sentar ao tomar banho de chuveiro, use um banco antiderrapante.   Instale barras de apoio pr?ximo ao vaso sanit?rio, na banheira e no chuveiro. N?o use suportes de toalha como barras de apoio.  O que posso fazer no quarto?   Lembre-se de manter uma l?mpada facilmente acess?vel pr?xima  cama.   N?o use len?is ou cobertores na cama esparramados at o ch?o.   Mantenha um banco ou cadeira firme com bra?os laterais que voc possa usar para se apoiar Psychologist, counselling se vestindo.  O que posso fazer na cozinha?   Seque l?quidos derramados imediatamente.   Caso precise alcan?ar algo acima da sua cabe?a, use uma escada dobr?vel firme com barras de apoio para as m?os.   Mantenha fios el?tricos fora do caminho.   N?o use cera para assoalho que deixe o ch?o escorregadio.  O que posso fazer com minhas escadas?   N?o deixe nenhum objeto nas escadas.   Lembre-se que deve haver interruptores de luz no topo e no p das escadas. Providencie a instala?o caso voc n?o tenha nenhum.   Certifique-se de que haja corrim?os em ambos os [employer]. Conserte corrim?os quebrados ou soltos. Certifique-se de  que os corrim?os tenham a mesma extens?o que as escadarias.   Instale pisos antiderrapantes nos degraus de todas as escadas da sua casa, caso elas n?o tenham carpete.   Evite tapetes decorativos no topo ou no p de escadas ou fixe-os com fita para carpetes para evitar que eles se desloquem.   Escolha um modelo de carpete que n?o esconda as [employer]. Confira se o carpete est firmemente fixado aos degraus. Repare carpetes soltos ou gastos.  O que posso fazer do lado de fora de [place] casa?   Use luzes externas fortes.   Fa?a manuten?o das cal?adas e garagens e conserte rachaduras. Desobstrua as passagens de  todos os objetos que possam fazer voc trope?ar, como ferramentas ou pedras.   Passe tinta ou cole uma fita colorida ou de contraste para identificar claramente e ajud?-lo a ver soleiras elevadas nas portas.   Apare arbustos ou galhos que crescerem na entrada principal da sua casa.   Verifique se os corrim?os est?o fixados com firmeza e em bom [place]?o. Ambos os lados de todas as escadas devem ter corrim?os.   Instale guardas laterais ao [address] de p?rticos ou varandas elevadas.   Folhas, neve e gelo devem ser removidos regularmente. Use areia, sal ou produto para derreter neve nas cal?adas durante os meses de inverno se voc morar onde h gelo e neve.   Na garagem, limpe l?quidos derramados imediatamente, incluindo manchas de graxa ou ?leo.  Que outras medidas posso implementar?   Revise seus medicamentos com seu m?dico. Alguns medicamentos podem causar confus?o ou tontura. Isso pode aumentar seu risco de sofrer quedas.   Use cal?ados fechados de tamanho confort?vel e que ofere?am apoio aos seus p?s. Use cal?ados com sola de borracha e salto baixo.   Use uma bengala, andador, cadeira de rodas el?trica ou muletas que ajudem voc a se deslocar, se necess?rio.  Converse com seu m?dico sobre outras maneiras nas quais voc pode reduzir seu risco de [place]. Isso pode incluir consultar um fisioterapeuta para aprender a Librarian, academic?cios para melhorar o movimento e a for?a.  Onde conseguir [employer] for [employer] and Prevention, Stopping Elderly Accidents, Deaths and Injuries, STEADI (Centros para Controle e Preven?o de Doen?as, Programa de Preven?o de Acidentes, Mortes e Les?es de Systems analyst): TonerPromos.no   [employer] on Aging (Training and development officer do Envelhecimento): [email or web address].pl   National Institute on Aging Lawyer do Envelhecimento): BaseRingTones.pl  Entre em contato com um m?dico se:   Nurse, mental health com medo de cair em casa.   Sentir-se fraco, sonolento ou tonto em  casa.   Cair em casa.  Busque ajuda imediatamente se voc?:   Perder a consci?ncia ou tiver dificuldade para se mexer ap?s uma queda.   Sofrer uma queda que cause uma les?o na cabe?a.  Esses sintomas podem ser uma emerg?ncia. Busque ajuda imediatamente. Ligue para 911.   N?o espere para ver se os sintomas desaparecem.   N?o dirija por conta pr?pria at o hospital.  Estas informa?es n?o se destinam a substituir as recomenda?es de seu m?dico. N?o deixe de discutir quaisquer d?vidas com seu m?dico.  Document Released: 2008-10-03 Document Updated: 2022-03-09 Document Reviewed: 2022-03-09  Elsevier Patient Education ? 2024 Elsevier Inc.  Head Injury, Adult    There are many types of head injuries. They can be as minor as a small bump. Some head injuries can be worse. Worse injuries include:   A strong hit to the head that shakes the  brain back and forth, causing damage (concussion).   A bruise (contusion) of the brain. This means there is bleeding in the brain that can cause swelling.   A cracked skull (skull fracture).   Bleeding in the brain that gathers, gets thick (makes a clot), and forms a bump (hematoma).  Most problems from a head injury come in the first 24 hours. However, you may still have side effects up to 7?10 days after your injury. It is important to watch your condition for any changes. You may need to be watched in the emergency department or urgent care, or you may need to stay in the hospital.  What are the causes?  There are many possible causes of a head injury. A serious head injury may be caused by:   A car accident.   Bicycle or motorcycle accidents.   Sports injuries.   Falls.   Being hit by an object.  What are the signs or symptoms?  Symptoms of a head injury include a bruise, bump, or bleeding where the injury happened. Other physical symptoms may include:   Headache.   Feeling like you may vomit (nauseous) or vomiting.   Dizziness.   Blurred or double vision.   Being uncomfortable around  bright lights or loud noises.   Feeling tired.   Trouble waking up.   Severe symptoms such as:  ? Feeling weak or numb on one side of the body.  ? Slurred speech.  ? Swallowing problems.  ? Fainting.  ? Shaking movements that you cannot control (seizures).  Mental or emotional symptoms may include:   Feeling grumpy or cranky.   Confusion and memory problems.   Having trouble paying attention or concentrating.   Changes in eating or sleeping habits.   Feeling worried or nervous (anxious).   Feeling sad (depressed).  How is this treated?  Treatment for this condition depends on how bad the injury is and the type of injury you have. The main goal is to prevent problems and to allow the brain time to heal.  Mild head injury  If you have a mild head injury, you may be sent home, and treatment may include:   Being watched. A responsible adult should stay with you for 24 hours after your injury and check on you often.   Physical rest.   Brain rest.   Pain medicines.  Very bad head injury  If you have a very bad head injury, treatment may include:   Being watched closely. This includes staying in the hospital.   Medicines to:  ? Help with pain.  ? Prevent seizures.  ? Help with brain swelling.   Protecting your airway and using a machine that helps you breathe (ventilator).   Watching for and manage swelling inside the brain.   Brain surgery. This may be needed to:  ? Remove a collection of blood or blood clots.  ? Stop the bleeding.  ? Remove a part of the skull. This allows room for the brain to swell.  Follow these instructions at home:  Activity   Rest.   Avoid activities that are hard or tiring.   Make sure you get enough sleep.   Let your brain rest. Do fewer activities that need a lot of thought or attention, such as:  ? Watching TV.  ? Playing memory games and doing puzzles.  ? Job-related work or homework.  ? Working on [employer], [employer], and texting.   Avoid activities that could  cause another head injury  until your doctor says it is okay. This includes playing sports.   Ask your doctor when it is safe for you to go back to your normal activities, such as work or school.   Ask your doctor when you can drive, ride a bicycle, or use machines. Do not do these activities if you are dizzy.  Lifestyle     Do not drink alcohol until your doctor says it is okay.   Do not use drugs.   If it is hard to remember things, write them down.   If you are easily distracted, try to do one thing at a time.   Talk with family members or close friends when making important decisions.   Tell your friends, family, a trusted co-worker, and work Production designer, theatre/television/film about your injury, symptoms, and limits (restrictions). Have them watch for any problems that are new or getting worse.  General instructions   Take over-the-counter and prescription medicines only as told by your doctor.   Have a responsible adult stay with you for 24 hours after your head injury. This person should watch you for any changes in your symptoms and be ready to get help.   Keep all follow-up visits to catch any new problems early.  How is this prevented?  Having another head injury can be dangerous. Another injury can lead to brain damage, brain swelling, or death. You can avoid this by:   Working on Therapist, music. This can help you avoid falls.   Wearing a seat belt when you are in a moving vehicle.   Wearing a helmet when you:  ? Ride a bicycle.  ? Ski.  ? Do any other sport or activity that has a risk of injury.   Making your home safer by:  ? Getting rid of clutter from the floors and stairs. This includes things that can make you trip.  ? Using grab bars in bathrooms and handrails by stairs.  ? Placing non-slip mats on floors and in bathtubs.  ? Putting more light in dim areas.  Where to find more information   Brain Injury Association: biausa.org  Contact a doctor if:   These symptoms do not go away:  ? Headaches.  ? Dizziness.  ? Double vision or vision  changes.  ? Trouble sleeping.  ? Changes in mood.   You have new symptoms.  Get help right away if:   You have sudden:  ? Headache that is very bad.  ? Vomiting that does not stop.  ? Changes in the size of one of your pupils. Pupils are the black centers of your eyes.  ? Changes in how you see (vision).  ? More confusion or more grumpy moods.   You have a seizure.   Your symptoms get worse.   You have a clear or bloody fluid coming from your nose or ears.  These symptoms may be an emergency. Get help right away. Call 911.   Do not wait to see if the symptoms will go away.   Do not drive yourself to the hospital.  This information is not intended to replace advice given to you by your health care provider. Make sure you discuss any questions you have with your health care provider.  Document Released: 2008-04-29 Document Updated: 2022-03-04 Document Reviewed: 2022-03-04  Elsevier Patient Education ? 2024 Elsevier Inc.

## 2023-05-30 NOTE — ED Notes (Signed)
Pt ambulated to bathroom with hand held assist, confused, unable to provide urine sample. Family at bedside.     Gabriela Eves, RN  05/30/23 1300

## 2023-05-30 NOTE — ED Notes (Signed)
This RN assumed care of this pt. PT is sitting on stretcher, PWD, no acute distress, WOB even and unlabored. Denies any unmet needs at this time.        Gabriela Eves, RN  05/30/23 1300

## 2023-05-30 NOTE — ED Provider Notes (Signed)
[facility]  [address]  [place] [address]    [facility]  [address]  [place] [address]     HPI   Chief Complaint   Patient presents with    Fall        Patient with history of dementia, hypertension, high cholesterol, [facility] the [facility] with unwitnessed [facility].  Patient self reports she speaks [place] but is unable to give her own history.  Patient primary [place] speaking but does not seem to respond any better to conversation in [place].  Patient does not indicate any pain and does not grimace with any particular aspect of exam.  Reviewed [facility] paperwork.        Patient History   Past Medical History:   Diagnosis Date    BP (high blood pressure) (CMS-HCC)     Dementia (Multi-HCC)     High cholesterol (CMS-HCC)      History reviewed. No pertinent surgical history.  No family history on file.  Social History     Tobacco Use    Smoking status: Never    Smokeless tobacco: Never   Substance Use Topics    Alcohol use: Not on file    Drug use: Not on file       Review of Systems   Review of Systems    Physical Exam   ED Triage Vitals   Temp Pulse Resp BP   05/30/23 0756 05/30/23 0756 05/30/23 0756 05/30/23 0756   37.7 C (99.9 F) 85 20 (!) 145/81      SpO2 Temp Source Heart Rate Source Patient Position   05/30/23 0756 05/30/23 0756 05/30/23 0756 05/30/23 1328   96 % Axillary Monitor Lying      BP Location FiO2 (%)     05/30/23 0756 --     Left arm        Physical Exam  Vitals and nursing note reviewed.   Constitutional:       General: She is not in acute distress.     Appearance: Normal appearance. She is well-developed. She is not ill-appearing.   HENT:      Head: Normocephalic and atraumatic.      Comments: No contusions, no lacerations     Nose: Nose normal. No congestion.      Mouth/Throat:      Mouth: Mucous membranes are moist.      Pharynx: Oropharynx is clear. No oropharyngeal exudate or posterior oropharyngeal erythema.       Comments: No oral injury  Eyes:      Extraocular Movements: Extraocular movements intact.      Conjunctiva/sclera: Conjunctivae normal.      Pupils: Pupils are equal, round, and reactive to light.      Comments: 4 mm   Neck:      Comments: No tenderness palpation, normal alignment along C-spine  Cardiovascular:      Rate and Rhythm: Normal rate and regular rhythm.      Pulses: Normal pulses.      Heart sounds: Normal heart sounds. No murmur heard.  Pulmonary:      Effort: Pulmonary effort is normal. No respiratory distress.      Breath sounds: Normal breath sounds.   Abdominal:      Palpations: Abdomen is soft.      Tenderness: There is no abdominal tenderness. There is no right CVA tenderness, left CVA tenderness, guarding or rebound.      Comments:  No back or hip contusions.  No tenderness to vertebral palpation, normal alignment   Genitourinary:     Comments: No GU or buttock rash  Musculoskeletal:         General: No swelling or signs of injury. Normal range of motion.      Cervical back: Normal range of motion and neck supple. No tenderness.      Right lower leg: No edema.      Left lower leg: No edema.      Comments: Though not on command is able to spontaneously do full range of motion bilateral upper extremity bilateral lower extremity equally   Skin:     General: Skin is warm and dry.      Capillary Refill: Capillary refill takes less than 2 seconds.   Neurological:      General: No focal deficit present.      Mental Status: She is alert. She is disoriented.      Comments: No focal motor deficit.  Not follow commands for finger-nose or heel-to-shin cerebellum testing   Psychiatric:         Mood and Affect: Mood normal.       CT HEAD WO CONTRAST   Final Result   1.  No acute intracranial abnormality is seen.   2.  Volume loss and microvascular ischemic change      Althia Forts, MD 05/30/2023 8:23 AM      CT CERVICAL SPINE WO CONTRAST   Final Result   No acute fracture or subluxation. 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         Althia Forts, MD 05/30/2023 8:27 AM          Labs Reviewed   COMPREHENSIVE METABOLIC PANEL - Abnormal       Result Value    Sodium 144      Potassium 4.3      Chloride 112 (*)     CO2 (Bicarbonate) 26      Anion Gap 6      BUN 19      Creatinine 1.48 (*)     eGFRcr 36 (*)     Glucose 94      Fasting? Unknown      Calcium 9.0      AST 19      ALT 22      Alkaline phosphatase 117      Protein, total 7.2      Albumin 3.5      Bilirubin, total 0.4     CBC WITH DIFFERENTIAL - Abnormal    WBC 7.6      RBC 3.79      Hemoglobin 11.9      Hematocrit 36.1      MCV 95.3      MCH 31.4      MCHC 33.0      RDW-CV 12.7      RDW-SD 44.1      Platelets 204      MPV 8.8 (*)     Neutrophil % 71.9      Lymphocyte % 17.7      Monocytes % 8.7      Eosinophils % 0.8      Basophils % 0.5      Immature Granulocytes % 0.4      NRBC % 0.0      Neutrophils Absolute 5.43      Lymphocytes Absolute 1.34      Monocytes Absolute 0.66  Eosinophils Absolute 0.06      Basophils Absolute 0.04      Immature Granulocytes Absolute 0.03      NRBC Absolute 0.00     SARS-COV-2 AND INFLUENZA A/B RNA BY PCR - Abnormal    Influenza A RNA Not Detected      Influenza B RNA Not Detected      SARS-CoV-2 RNA PCR Detected (*)    TROPONIN I, HIGH SENSITIVITY - Normal    Troponin I, High Sensitivity 6      Narrative:     Clinical correlation, HEART Score and shared decision making must be taken into account.                   For Chest Pain >3 Hours    Rule-Out Criteria              Single Value     0hr/1hr Delta Value  Female  <10ng/L*          <54ng/L AND delta <15ng/L  Female    <10ng/L*          <79ng/L AND delta <15ng/L    Cannot Rule-Out                            0hr/1hr Delta Value  Female    <54ng/L AND delta >15ng/L    >/= 54 AND delta </=15ng/L  Female      <79ng/L AND delta >15ng/L    >/= 79 AND delta </=15ng/L    Rule-In Criteria               Single Value   0hr/1hr Delta Value                 Female   >115ng/L        >/=54ng/L AND delta >/=15ng/L         Female     >115ng/L       >/=79ng/L AND delta >/=15ng/L           *Note: for Chest pain <3 hours 0hr/1hr is warranted for evaluation.     CK - Normal    CK 96.0     COVID-19 AND OTHER RESPIRATORY VIRAL PCR TESTING    Narrative:     The following orders were created for panel order COVID-19 and Other Respiratory Viral PCR Tests.  Procedure                               Abnormality         Status                     ---------                               -----------         ------                     SARS-CoV-2 and Influenza.Marland KitchenMarland Kitchen[[id number]]  Abnormal            Final result                 Please view results for these tests on the individual orders.   CBC W/DIFF    Narrative:     The following orders were created for panel  order CBC W/DIFF.  Procedure                               Abnormality         Status                     ---------                               -----------         ------                     CBC w/ Differential[[id number]]          Abnormal            Final result                 Please view results for these tests on the individual orders.   URINE TESTING (UA, UARC, URINE CULTURE)    Narrative:     The following orders were created for panel order Urine Testing (UA, UARC, Urine Culture).  Procedure                               Abnormality         Status                     ---------                               -----------         ------                     Urinalysis reflex to cul.Marland Kitchen.[[id number]]                                                   Please view results for these tests on the individual orders.   URINALYSIS REFLEX TO CULTURE       Glasgow Coma Scale Score: 15      ED Course & MDM   ED Course as of 05/30/23 1440   Mon May 30, 2023   0802 In to see patient   0815 ECG 12 lead  SR 78, no ST changes, nl EKG. Independent interpretation.     2542 CT HEAD WO CONTRAST  No acute ICH. Independent interpretation.  For radiology read   [phone] Hemoglobin: 11.9  No  anemia   0824 Auto WBC: 7.6  Nl wbc   0830 Troponin I, High Sensitivity: 6  Negative trop   0830 Creatinine(!): 1.48  Mild renal insufficiency, for hydration   0953 Troponin I, High Sensitivity: 6  Negative trop   0954 CK: 96.0  Nl, no rhabdo   0954 Talked with daughter via phone, she is on way in   1212 Able to walk with PT, slowly, cleared for home.   1226 Urine Testing (UA, UARC, Urine Culture)  Declined testing   1226 Reviewed with Patient's daughter at bedside. Feels best for her to return to [facility] in her normal surroundings.   1229 COC reports  [facility] requesting covid testing as several residents have tested positive and requesting screening. Okay to return with mask wearing.   1412 SARS-CoV-2 RNA PCR(!): Detected  Patient confirmed positive at time of arrival of transport team called when discharged prior to request of [facility] for covid testing.  Nurse gave tylenol and discharged as per previous instruction. Had confirmed with [facility] that fine to return.           Diagnoses as of 05/30/23 1440   Fall, initial encounter   Closed head injury, initial encounter   COVID-19 - Test positive - no respiratory symptoms       Medical Decision Making  Dilation for fall, unclear if syncope or mechanical fall as patient has dementia.  Screening labs CT head neck to evaluate for intracranial hemorrhage or fracture.  For physical therapy ambulation testing.  Daughter came to bedside and confirms patient at baseline though it seems a little bit more sleepy.  Patient is able to ambulate and without complaint though is slow in doing so and needs redirection with her dementia.  She was unable to provide urine sample.  Daughter reports she does wear adult depends at baseline and is not sure if she actually uses the bathroom when encouraged by staff at facility.  Discussed possibility of UTI, and urine testing by Foley catheter versus continuing observation for her health.  Daughter prefers not to have straight cath and declines  urine testing at this time.  Daughter prefers [facility] to [facility] where she will be more comfortable rather than admitted here for possible concussion or observation for possible syncope.  She was ready for discharge with transportation called when case management was in discussion with [facility] where they are having several cases of COVID-19 and requested that the patient be tested.  Oblige request for testing.  Patient has no respiratory symptoms in regards to COVID-19.  She did test positive and at time of discharge with transport on discharge vitals did have temperature and was given Tylenol.   Called daughter and discussed risks and benefits of antiviral treatment. Was initially considering no treatment as history of vaccination for covid and flu last month and no respiratory symptoms.  But now with patient with  mild weakness and spiking a fever with +sick contacts at facility, will offer treatment as patient >65 as recommended. Daughter would like her to receive antiviral. COC contacted and updated. She recommends sending prescription to patients listed pharmacy and she will update facility.  Paxlovid renal dose 5 day course Rx'd.    Problems Addressed:  Closed head injury, initial encounter: complicated acute illness or injury  Fall, initial encounter: complicated acute illness or injury    Amount and/or Complexity of Data Reviewed  External Data Reviewed: notes.     Details: [facility] documents. [facility] DNR/DNI  Labs: ordered. Decision-making details documented in ED Course.  Radiology: ordered. Decision-making details documented in ED Course.  ECG/medicine tests: ordered. Decision-making details documented in ED Course.    Risk  OTC drugs.  Prescription drug management.  Decision regarding hospitalization.                            Coy Saunas, MD  05/30/23 [phone]

## 2023-05-30 NOTE — Consults (Signed)
Physical Therapy Evaluation    Patient Name: Kim Blake  MRN: [id number]  DOB: June 26, 1945  Evaluation Date: 05/30/2023    Subjective   HPI/Hospital Course:   Pt is a 77 y/o female who presents from a locked memory care unit for an unwitnessed fall w/o injury.    Patient Active Problem List   Diagnosis    Agitation       Past Medical History:   Diagnosis Date    BP (high blood pressure) (CMS-HCC)     Dementia (Multi-HCC)     High cholesterol (CMS-HCC)        History reviewed. No pertinent surgical history.    Objective     General Information  General Info  PT Received On: 05/30/23  Interpreter: Family Interpreted  Following Therapy Session:: Patient in Bed, Nursing Staff Aware of Patient Location, Family/visitor Present  Plan of Care Reviewed With:: Patient, Case Manager, MD/PA/NP, RN/Charge Nurse, Family  Recommended mobility with nursing staff: 1A (hands on assist)  Eval Information  Type of Evaluation: Initial Evaluation    Precautions  Other Precautions: Fall Risk, Delirium Risk    Home Living  Was Patient Admitted from STR?: No  Type of Home: Assisted Living (memory care unit)  Home Layout: One Level  Number of Stairs to Enter Home: 0  Number of Stairs Within Home: 0  Bathroom Shower/Tub: Pension scheme manager: Raised  ADL/IADL Equipment: Paediatric nurse, Engineer, materials Around Pensions consultant, Engineer, materials in Air traffic controller    Prior Level of Function  Information Provided By: EMR, Children  Needs Assistance with at Baseline: All ADL's, All IADL's, Household Mobility, Without Device, Community Mobility  History of Recent Falls: Yes  Work Status: Retired    Pain  Pain Assessment: Insurance account manager FACES Pain Rating: No hurt       Vital Signs       Cognition  Overall Cognitive Status: Impaired  Orientation Level: Oriented to person, Disoriented to time, Disoriented to situation, Disoriented to place  Following Commands: Difficulty following one step commands  Safety Judgment: Decreased Safety awareness  Awareness  of Errors/Deficits: Impaired  Attention Span: Impaired  Memory: Impaired  Problem Solving: Impaired  Communication: Slurred or unclear diction  Behavioral/Affect Comments: cooperative    Integumentary  LDA Integrity: Intact Pre and Post Therapy    ROM/Strength     Strength Right Lower Extremity  RLE Overall Strength: WFL  Strength Left Lower Extremity  LLE Overall Strength: WFL    Neuromuscular Assessment            Mobility Assessment  Bed Position: HOB elevated  Roll Right: Contact Guard, Cues for Sequencing  Supine to Sit: Advertising account executive, Cues for Sequencing  Sit to Supine: Minimal Assist, Cues for Sequencing  Scooting: Advertising account executive, Cues for Sequencing    Sit to Stand: Advertising account executive, Cues for Sequencing, Cues for Hand Placement  Stand to Sit: Advertising account executive, Cues for Sequencing, Cues for Optometrist Device: Other (Comment) (HHA)  Transfers Comments: pt req extra time for processsing    Distance (Feet): 40x2  Assistance Level: Engineer, manufacturing systems Device: Other (Comment) (HHA)  Gait Characteristics: short stride length              Verizon Scale: Yes  KU Sitting Balance Scale: 4 - Independently moves and returns to center of gravity 1-2 inches in 1 plane.  KU Standing Balance Scale: 3 - Independently stands without upper extremity support for up  to 30 seconds.    AMPAC - (Basic Mobility Inpatient Short Form) How much help from another person do you currently need?  Turning from your back to your side while in a flat bed without using bedrails?: 3 - A little  Moving from lying on your back to sitting on the side of a flat bed without using bedrails?: 3 - A little  Moving to and from a bed to a chair (including a wheelchair)?: 3 - A little  Standing up from a chair using your arms (e.g., wheelchair, or bedside chair)?: 3 - A little  To walk in hospital room?: 3 - A little  Climbing 3-5 steps with a railing?: 2 - A Lot  Raw Score: 17         Education  Education Provided: Role of  PT/Plan of Care, Discharge Planning, Adaptive Equipment/Assistive Device, Development worker, community, Safety Awareness/Fall Risk  Education Provided to: Daughter  Teaching Method: Discussion  Barriers to learning: Acuity of illness, Cognitive deficits, Language barrier  Learning Evaluation: Needs practice, Needs reinforcement/further teaching    Assessment       Patient presents with Impaired gait, Functional Mobility deficit, Impaired safety awareness, Balance deficit, Strength deficit, Impaired cognition, however able to transfer and ambulate short distances with HHA. Her daughter was present for today's eval and feels she is near her baseline mobility. Will rec dc back to her facility w/ 24hr supervision and VNA.               Goals   Short Term Goals  Date Established/Amended: 05/30/23  Goal timeframe: 2 week  Bed mobility goal: I w/ bed mobility  Transfers goal: S all transfers w/ LRAD  Ambulation goal: amb .163ft w/ LRAD and close supervision         Plan   Plan: Continued Skilled Inpatient PT Services   Treatment Interventions: Therapeutic exercise, Assistive device training, Gait training, Patient/family education, Neuromuscular Re-education/balance  PT Frequency and Duration: 3-5 x/week until Discharge  Treatment:      Recommendations  Anticipate Discharge to: Prior living environment  Discharge Assist/Support Needed: 24/7 supervision, Physical assistance from family/caregiver  Home services recommended: Home OT, Home PT  Recommended mobility with nursing staff: 1A (hands on assist)  Norina Buzzard, PT   lic # [id number]

## 2023-05-30 NOTE — Progress Notes (Signed)
[place] MEDICINE POST ACUTE SERVICES REFERRAL FORM  Demographics       Address   [facility] [address] [place] [address]    Phone Numbers   Hm: [phone] Cell: [phone]    Marital Status   Widowed    Insurance Information   [place] MEDICARE    Religion   None             Allergies (Reviewed on: 05/30/23)    No Known Allergies       Health Care Agents    There are no Health Care Agents on file.       Preferred Language       Preferred Language  [place] Preferred Written Language  English             Medication List        START taking these medications      Paxlovid 150-100 mg tablets,dose pack dose pack  Take 2 tablets (1 Dose pack) by mouth twice daily for 5 days.  Generic drug: nirmatrelvir-ritonavir (renal dosing)            ASK your doctor about these medications      lisinopriL-hydrochlorothiazide 20-12.5 mg tablet  Take 1 tablet by mouth in the morning.     rosuvastatin 40 mg tablet  Commonly known as: Crestor  Take 40 mg by mouth in the morning.                    Discharge Referral Orders (From admission, onward)       Start     Ordered    05/30/23 0000  Ambulatory referral to Home Health  (Case Management Discharge Orders)        Comments: Special Instructions:      I attest that I or another qualified licensed provider saw Kim Blake 90 days prior to or 30 days post admission and this face to face encounter meets the necessary Home Health requirements. The face to face encounter occurred on (date) 05/30/23.    The encounter with the patient was in whole, or in part, for the following medical condition, which is the primary reason for home health care. (List medical condition) FALL    I certify that, based on my findings, the following services are medically necessary skilled home health services: Evaluate and Treat and Strengthening Exercises.    Further, I certify that my clinical findings support this patient's homebound status (i.e. absences from home require considerable  and taxing effort, are for health treatment, or for attendance at religious events; absences from home for nonmedical reasons are infrequent or are of relatively short duration).    The clinical findings that support the need for home care and homebound status are due to illness and the patient has a condition such that leaving his/her home is medically contraindicated.  There exists a normal inability to leave home and leaving home requires a considerable and taxing effort including worsening clinical course   Question Answer Comment   Disciplines Requested Physical Therapy    Services Requested Strengthening/Exercise    Is patient a part of Maternal Child Health program? No    Physician to follow patient's care PCP        05/30/23 1225    05/30/23 0000  Ambulatory referral to Home Health  (Case Management Discharge Orders)        Comments: Special Instructions:      I attest that  I or another qualified licensed provider saw Kim Blake 90 days prior to or 30 days post admission and this face to face encounter meets the necessary Home Health requirements. The face to face encounter occurred on (date) 05/30/23.    The encounter with the patient was in whole, or in part, for the following medical condition, which is the primary reason for home health care. (List medical condition) Covid 19    I certify that, based on my findings, the following services are medically necessary skilled home health services: Evaluate and Treat and Strengthening Exercises.    Further, I certify that my clinical findings support this patient's homebound status (i.e. absences from home require considerable and taxing effort, are for health treatment, or for attendance at religious events; absences from home for nonmedical reasons are infrequent or are of relatively short duration).    The clinical findings that support the need for home care and homebound status are due to illness and the patient has a condition such that leaving his/her  home is medically contraindicated.  There exists a normal inability to leave home and leaving home requires a considerable and taxing effort including worsening clinical course   Question Answer Comment   Disciplines Requested Physical Therapy    Services Requested Strengthening/Exercise    Is patient a part of Maternal Child Health program? No    Physician to follow patient's care PCP        05/30/23 1453                  Nursing Post-Acute/Referral Information (Page 2)      Flowsheet Row Most Recent Value   Height and Weight    Height 1.676 m   Height Method Estimated   Weight 74.8 kg          After Discharge Information       None          General Therapy Patient Information (Page 3)      Flowsheet Row Most Recent Value   Precautions    Other Precautions Fall Risk, Delirium Risk Filed at 05/30/2023 1040   Home Living    Was Patient Admitted from STR? No Filed at 05/30/2023 1040   Type of Home Assisted Living  [facility] care unit] Filed at 05/30/2023 1040   Home Layout One Level Filed at 05/30/2023 1040   Number of Stairs to Enter Home 0 Filed at 05/30/2023 1040   Number of Stairs Within Home 0 Filed at 05/30/2023 1040   Bathroom Shower/Tub Walk-in Shower Filed at 05/30/2023 1040   Bathroom Toilet Raised Filed at 05/30/2023 1040   ADL/IADL Patent attorney, Engineer, materials [employer] consultant, Engineer, materials in [place] Filed at 05/30/2023 1040   Prior Level of Function    Information Provided By EMR, Children Filed at 05/30/2023 1040   Needs Assistance with at Baseline All ADL's, All IADL's, Household Mobility, Without Device, Community Mobility Filed at 05/30/2023 1040   History of Recent Falls Yes Filed at 05/30/2023 1040   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   Cognition    Overall Cognitive Status Impaired Filed at 05/30/2023 1040   Orientation Level Oriented to person, Disoriented to time, Disoriented to situation, Disoriented to place Filed at 05/30/2023 1040   Following Commands Difficulty following one step  commands Filed at 05/30/2023 1040   Safety Judgment Decreased Safety awareness Filed at 05/30/2023 1040   Awareness of Errors/Deficits Impaired Filed at 05/30/2023 1040   Attention  Span Impaired Filed at 05/30/2023 1040   Memory Impaired Filed at 05/30/2023 1040   Problem Solving Impaired Filed at 05/30/2023 1040   Communication Slurred or unclear diction Filed at 05/30/2023 1040   Behavioral/Affect Comments cooperative Filed at 05/30/2023 1040          Combined Therapy Assessment (Page 3)      Flowsheet Row Most Recent Value   Bed Mobility    Bed Position HOB elevated Filed at 05/30/2023 1040   Roll Right Contact Guard, Cues for Sequencing Filed at 05/30/2023 1040   Supine to [employer], Cues for Sequencing Filed at 05/30/2023 1040   Sit to Supine Minimal Assist, Cues for Sequencing Filed at 05/30/2023 1040   Scooting [employer], Cues for Sequencing Filed at 05/30/2023 1040   Transfers    Sit to [employer], Cues for Sequencing, Cues for Hand Placement Filed at 05/30/2023 1040   Stand to [employer], Cues for Sequencing, Cues for Hand Placement Filed at 05/30/2023 1040   Transfers Comments pt req extra time for processsing Filed at 05/30/2023 1040          Physical Therapy - Mobility (Page 3)      Flowsheet Row Most Recent Value   Ambulation    Distance (Feet) 40x2 Filed at 05/30/2023 1040   Assistance Level Contact Guard Filed at 05/30/2023 1040   Assistive Device Other (Comment)  [HHA] Filed at 05/30/2023 1040   Gait Characteristics short stride length Filed at 05/30/2023 1040            Discharge Summary    No notes of this type exist for this encounter.

## 2023-06-11 ENCOUNTER — Emergency Department: Admit: 2023-06-11 | Payer: MEDICARE

## 2023-06-11 ENCOUNTER — Inpatient Hospital Stay
Admission: EM | Admit: 2023-06-11 | Discharge: 2023-06-15 | Disposition: A | Discharge status: Skilled Nursing Facility | Payer: MEDICARE | Admitting: Nurse Practitioner

## 2023-06-11 DIAGNOSIS — R531 Weakness: Secondary | ICD-10-CM

## 2023-06-11 DIAGNOSIS — E860 Dehydration: Secondary | ICD-10-CM

## 2023-06-11 LAB — MANUAL DIFFERENTIAL
Lymphocytes %: 4 %
Lymphocytes Absolute: 0.78 10*3/uL (ref 0.70–4.00)
Monocytes %: 6 %
Monocytes Absolute: 1.18 10*3/uL — ABNORMAL HIGH (ref 0.36–0.77)
Neutrophils %: 90 %
Neutrophils Absolute: 17.64 10*3/uL — ABNORMAL HIGH (ref 1.50–7.95)
PLT Morphology: NORMAL
RBC Morphology: NORMAL
Total WBC Counted: 100

## 2023-06-11 MED ORDER — rosuvastatin (Crestor) tablet 10 mg
20 | Freq: Every day | ORAL | Status: DC
Start: 2023-06-11 — End: 2023-06-14
  Administered 2023-06-12: 14:00:00 10 mg via ORAL

## 2023-06-11 MED ORDER — lactated Ringer's bolus 1,000 mL
Freq: Once | INTRAVENOUS | Status: AC
Start: 2023-06-11 — End: 2023-06-11
  Administered 2023-06-11: 21:00:00 1000 mL via INTRAVENOUS

## 2023-06-11 MED ORDER — lactated Ringer's bolus 1,000 mL
Freq: Once | INTRAVENOUS | Status: AC
Start: 2023-06-11 — End: 2023-06-11
  Administered 2023-06-12: 03:00:00 1000 mL via INTRAVENOUS

## 2023-06-11 MED ORDER — iohexol (OMNIPaque) 350 mg iodine/mL solution 100 mL
350 | Freq: Once | INTRAVENOUS | Status: AC
Start: 2023-06-11 — End: 2023-06-11
  Administered 2023-06-11: 20:00:00 95 mL via INTRAVENOUS

## 2023-06-11 MED ORDER — heparin (porcine) injection 5,000 Units
5000 | Freq: Three times a day (TID) | INTRAMUSCULAR | Status: DC
Start: 2023-06-11 — End: 2023-06-14
  Administered 2023-06-12 – 2023-06-14 (×7): 5000 [IU] via SUBCUTANEOUS

## 2023-06-11 NOTE — H&P (Addendum)
Internal Medicine History and Physical    Chief complaint/reason for hospitalization: Dehydration    History of Presenting Illness:    78 year old female with past medical history of    Hyperlipidemia  Hypertension  Dementia    Presented to the emergency department complaining of abdominal pain and diarrhea.  She also recently tested positive for COVID-19 infection on December 30.  There was a concern for dehydration in the ED for which she received 2 L of LR bolus, stool studies were ordered and medicine was paged for admission.    Per ED signout, daughter noticed patient to be "off" for the past few days. She also reported some hand pain on the left side.     Case discussed with ED provider.    Patient is very confused I believe from her dementia.  In spite of arranging for an interpreter, she was not really able to participate in the conversation.  She was awake though.    Review of systems   Unable to perform    Past medical history  As noted above    History reviewed. No pertinent surgical history.    Significant home Medications:  Lisinopril 20 mg daily  Hydrochlorothiazide 12.5 mg daily    Rosuvastatin 40 mg daily    No Known Allergies    Vital Signs:  BP 123/87   Pulse 81   Temp 37.1 C (98.7 F) (Oral)   Resp 16   Ht 1.676 m   Wt 74.8 kg   SpO2 99%   BMI 26.63 kg/m         Physical Exam  Constitutional:       General: She is not in acute distress.     Appearance: She is not ill-appearing or toxic-appearing.   Cardiovascular:      Rate and Rhythm: Normal rate.      Heart sounds: Normal heart sounds. No murmur heard.     No friction rub. No gallop.   Pulmonary:      Effort: Pulmonary effort is normal. No respiratory distress.      Breath sounds: Normal breath sounds. No wheezing, rhonchi or rales.   Abdominal:      General: Bowel sounds are normal.      Palpations: Abdomen is soft.      Tenderness: There is no abdominal tenderness.   Musculoskeletal:      Right lower leg: No edema.      Left lower  leg: No edema.      Comments: No gross abnormality noted over her medial right thigh.  No gross abnormality noted on either of her hands either.   Neurological:      Comments: Confused          >/= 3 labs from this admission reviewed     Imaging:  X-ray of left hand showed no acute abnormality  X-ray of right hand showed no acute fracture on radiograph  CT abdomen pelvis showed no acute process.    EKG:  From May 30, 2023 in my independent interpretation suggestive of sinus rhythm.  QTc B noted to be 437.    Assessment and plan:    COVID-19 infection  Patient's respiratory status is stable  She was prescribed Paxlovid on December 30    Diarrhea and abdominal pain  Could be in the setting of COVID-19 infection  Continue fluid resuscitation  Repeat lactate level in process  C. difficile, stool pathogen and fecal leukocytes ordered and pending.    AKI  on CKD  Could be in the setting of dehydration  Continue fluid resuscitation and recheck BMP in the morning    Syncope  Patient presented to the ER on December 30 after an unwitnessed fall however was sent back to the ED as per her daughter's request that time.  Start workup with orthostatic blood pressure.  Will have to call daughter in the morning to get more history about the syncopal episode and her overall baseline mentation.    Hypertension  Hold off on lisinopril and hydrochlorothiazide given AKI and dehydration    Hyperlipidemia  Rosuvastatin 40 mg daily    Left hand pain as reported to me by ER  Xray did not reveal anything acute    Microscopic proteinuria  Elevated alkaline phosphatase  Monocytosis  Outpatient follow-up    Fat tissue density around right femoral vein.  CT scan could not differentiate between hernia or nonencapsulated lipoma.  This can be followed up as an outpatient with an MRI.  No incarceration as reported to ED physician by radiologist.    DVT prophylaxis: Heparin  GI prophylaxis: Not indicated  Diet: Regular diet  Telemetry:  Indicated    Barbie Haggis, MD

## 2023-06-11 NOTE — ED Notes (Addendum)
1930: pt resting awake in bed. Pts depends changed and purewick. This RN attempted to obtain a stool sample but not enough stool in depends to get one. Pt given warm blankets and boosted in bed. Pts fluids still running from earlier d/t pt arm not in a straight position. This RN explained to pt to keep arm straight but pt having trouble following commands. Pt currently resting in bed with daughter at bedside.    2130: Pt was unable to keep arms straight to get fluids in. Two RNs attempted to get [place] line in so fluids could go into pt. [place] line now in pts arm. Fluids flowing in without problems. Pt readjusted in in bed.     0145: pt resting in bed with eyes closed. VSS and chest rising equally with unlabored breaths.    0322: pt resting in bed with eyes closed. VSS and chest rising equally with unlabored breaths.    0615: pt boosted in bed and depends was changed. Pt has still not had a bowel movement.        Caden Hedquist, RN  06/12/23 0630

## 2023-06-11 NOTE — ED Notes (Signed)
Pt placed on purewick and cardiac monitoring placed on pt at this time.     Armstead Peaks, RN  06/11/23 1743

## 2023-06-11 NOTE — ED Triage Notes (Signed)
Biba from [facility]. C/O abdominal pain with loose stool that started this morning, also c/o some left hand pain. Pt noted to be leaning on left side.

## 2023-06-11 NOTE — ED Procedure Note (Signed)
Procedure  Critical Care    Performed by: Raeanne Barry, MD  Authorized by: Raeanne Barry, MD    Critical care provider statement:   Critical care time (minutes):  30    This time was exclusive of all other procedures, treating other patients, and teaching time.         Critical care was necessary to treat or prevent imminent or life-threatening deterioration of the following conditions:  Dehydration    Critical care was time spent personally by me on the following activities:  Blood draw for specimens, development of treatment plan with patient or surrogate, evaluation of patient's response to treatment, examination of patient, obtaining history from patient or surrogate, review of old charts, re-evaluation of patient's condition, pulse oximetry, ordering and review of radiographic studies, ordering and review of laboratory studies and ordering and performing treatments and interventions    Care discussed with: admitting provider                 Raeanne Barry, MD  06/12/23 0020

## 2023-06-11 NOTE — ED Provider Notes (Signed)
[facility]  [address]  [place] [address]    [facility] EM[facility]GENC[facility]IN [facility]  [address]  [place] [address]     HPI   Chief Complaint   Patient presents with    Abdominal Pain        Kim Blake is a 78 y.o. year old female patient with hx of  has a past medical history of BP (high blood pressure), Dementia (Multi-HCC), and High cholesterol. presenting w/ Abdominal Pain, diarrhea.  Presents from facility with report of weakness, nausea, vomiting, abdominal pain, diarrhea that reportedly started earlier today.  Patient reportedly has baseline dementia and did discuss with family who is healthcare proxy reports that patient is able to understand but does not communicate very well.  She does report that she saw patient 1 day prior and she was eating and drinking though seemed somewhat "off".  Patient with recent presentation where she was found to have COVID and recently completed Paxlovid in the last several days.  No report of additional medication started.  Per triage patient had also endorsed left hand pain on arrival though while attempting to communicate with patient she is reporting right hand pain.  No report of recent trauma.      History provided by:  Medical records and relative  History limited by:  Dementia      Patient History   Past Medical History:   Diagnosis Date    BP (high blood pressure)     Dementia (Multi-HCC)     High cholesterol      History reviewed. No pertinent surgical history.  No family history on file.  Social History     Tobacco Use    Smoking status: Never    Smokeless tobacco: Never   Substance Use Topics    Alcohol use: Not on file    Drug use: Not on file       Review of Systems   Review of Systems   Unable to perform ROS: Dementia   Gastrointestinal:  Positive for abdominal pain, diarrhea, nausea and vomiting.   Psychiatric/Behavioral:  Positive for confusion.        Physical Exam   ED Triage Vitals [06/11/23 1352]   Temp  Pulse Resp BP   37.1 C (98.7 F) 75 16 (!) 143/69      SpO2 Temp Source Heart Rate Source Patient Position   96 % Oral Monitor Sitting      BP Location FiO2 (%)     Right arm --       Physical Exam  Constitutional:       Comments: Physical Exam:  Lying in bed, fatigued, pale, incontinent of stool at time of evaluation.  Alert.  Intermittently answering questions and following commands.Normocephalic, atraumatic. EOMI. No conjunctival injection or discharge. Normal ROM of neck.   RRR.  CTAB, no w/r/r. Abdomen mildly distended, mild epigastric TTP, no rebound.  Pulses intact.  No peripheral edema.  Sensation intact.    HENT:      Head: Normocephalic and atraumatic.      Nose: Nose normal.      Mouth/Throat:      Mouth: Mucous membranes are dry.   Eyes:      Extraocular Movements: Extraocular movements intact.   Cardiovascular:      Rate and Rhythm: Normal rate and regular rhythm.      Pulses: Normal pulses.   Pulmonary:      Effort: Pulmonary effort  is normal. No respiratory distress.      Breath sounds: Normal breath sounds. No stridor. No wheezing or rales.   Abdominal:      General: There is no distension.      Tenderness: There is abdominal tenderness. There is no guarding or rebound.   Musculoskeletal:         General: No deformity.      Cervical back: Normal range of motion.      Right lower leg: No edema.      Left lower leg: No edema.   Skin:     Coloration: Skin is pale.   Neurological:      Mental Status: She is alert. She is disoriented.       No orders to display       Labs Reviewed   COMPREHENSIVE METABOLIC PANEL - Abnormal       Result Value    Sodium 140      Potassium 4.5      Chloride 110      CO2 (Bicarbonate) 26      Anion Gap 4      BUN 28 (*)     Creatinine 1.68 (*)     eGFRcr 31 (*)     Glucose 139 (*)     Fasting? Unknown      Calcium 9.4      AST 22      ALT 27      Alkaline phosphatase 131 (*)     Protein, total 7.6      Albumin 4.0      Bilirubin, total 0.4     CBC WITH DIFFERENTIAL -  Abnormal    WBC 19.6 (*)     RBC 4.31      Hemoglobin 13.6      Hematocrit 40.5      MCV 94.0      MCH 31.6      MCHC 33.6      RDW-CV 12.5      RDW-SD 43.8      Platelets 283      MPV 8.7 (*)     NRBC % 0.0      NRBC Absolute 0.00     MANUAL DIFFERENTIAL - Abnormal    Neutrophils % 90      Lymphocytes % 4      Monocytes % 6      Neutrophils Absolute 17.64 (*)     Lymphocytes Absolute 0.78      Monocytes Absolute 1.18 (*)     RBC Morphology Normal      PLT Morphology Normal      Total WBC Counted 100     LIPASE - Normal    Lipase 60     MAGNESIUM - Normal    Magnesium 2.0     FECAL LEUKOCYTES   STOOL PATHOGENS PANEL BY PCR   CLOSTRIDIOIDES DIFFICILE BY PCR   CBC W/DIFF    Narrative:     The following orders were created for panel order CBC and differential.  Procedure                               Abnormality         Status                     ---------                               -----------         ------  CBC w/ Differential[[id number]]          Abnormal            Final result               Manual Differential[[id number]]          Abnormal            Final result                 Please view results for these tests on the individual orders.   URINE TESTING (UA, UARC, URINE CULTURE)    Narrative:     The following orders were created for panel order Urine Testing.  Procedure                               Abnormality         Status                     ---------                               -----------         ------                     Urinalysis reflex to cul.Marland Kitchen.[[id number]]                                                   Please view results for these tests on the individual orders.   URINALYSIS REFLEX TO CULTURE   LACTIC ACID       Glasgow Coma Scale Score: 15      ED Course & MDM   ED Course as of 06/12/23 0017   Sat Jun 11, 2023   1437 Spoke w/ daughter - patient has hx of advanced dementia.  Reportedly patient was weak today w/ nausea, vomiting, diarrhea.  Recent COVID infection s/p  paxlovid.  Family saw her 1 day prior, and she seemed somewhat fatigued but was sitting up and eating.    1442 Comprehensive metabolic panel(!)  Na 140, K 4.5, AG 4, Cr 1.68, Glucose 139, AlkPhos at 131   1443 CBC and differential(!)  WBC 19.6, Hgb 13.6, Platelets 283   1607 XR HAND RIGHT 1-2 VIEWS  No obvious acute fracture or dislocation   1608 XR HAND LEFT 3+ VIEWS  No obvious acute fracture or dislocation   1710 Lactic acid, plasma(!)  Lactate at 3.8.  Mg at 2.     1711 CT ABDOMEN PELVIS W CONTRAST  Per radiology:   "IMPRESSION:  Motion limited exam. No definite acute process identified in the abdomen or pelvis.      Partial effacement of the right femoral vein. There is adjacent fat density tissue which does not appear clearly defined. The etiology of this is uncertain as this cannot be clearly qualify as a femoral hernia or other pathology such as nonencapsulated lipoma. Surgical consultation is suggested for clinical evaluation for hernia. MRI could also be performed for further evaluation on a nonemergent basis."     1742 Discussed w/ radiology - imaging not concerning for incarceration.  Somewhat non-specific region of fat.    1840 Urine Testing(!)  UA  negative   1950 ECG 12 lead  EKG w/ heart rate 71, PR 145, QRS 100, QTc 442, sinus, no STEMI         Diagnoses as of 06/12/23 0017   Weakness       Medical Decision Making  LINDY ZICARI is a 78 y.o. year old female patient with hx of  has a past medical history of BP (high blood pressure), Dementia (Multi-HCC), and High cholesterol. presenting w/ Abdominal Pain, diarrhea.  Presents from facility with report of weakness, nausea, vomiting, abdominal pain, diarrhea that reportedly started earlier today.  Patient reportedly has baseline dementia and did discuss with family who is healthcare proxy reports that patient is able to understand but does not communicate very well.  She does report that she saw patient 1 day prior and she was eating and drinking though  seemed somewhat "off".  Patient with recent presentation where she was found to have COVID and recently completed Paxlovid in the last several days.  No report of additional medication started.  Per triage patient had also endorsed left hand pain on arrival though while attempting to communicate with patient she is reporting right hand pain.  No report of recent trauma.  .      Patient denies hx of PE/DVT/CVA/ACS.  Patient denies hx of substance use.  Allergies as reported in chart.    ROS positive for nausea, vomiting, abdominal pain, diarrhea, hand pain    ---------------------------               06/11/23                      1352         ---------------------------   BP:        (!) 143/69       Pulse:         75           Resp:          16           Temp:   37.1 C (98.7 F)   SpO2:          96%         ---------------------------    Physical Exam:  Lying in bed, fatigued, pale, incontinent of stool at time of evaluation.  Alert.  Intermittently answering questions and following commands.Normocephalic, atraumatic. EOMI. No conjunctival injection or discharge. Normal ROM of neck.   RRR.  CTAB, no w/r/r. Abdomen mildly distended, mild epigastric TTP, no rebound.  Pulses intact.  No peripheral edema.  Sensation intact.         #AbdominalPain: 78 year old female with past medical history as described above presenting from facility in setting of reported nausea, vomiting, weakness, abdominal pain, diarrhea as well as question of hands pain.  There is no obvious deformity on either wrist or hand noted but will evaluate for possible fracture or dislocation at this time.  Patient abdomen soft at time of evaluation with very mild periumbilical tenderness to palpation though patient is incontinent of stool.  Patient likely at risk for C. Difficile colitis given living facility.  However this may also be GI infection given her recent COVID/viral infection as well as antiviral use for the last  several days.  Will evaluate for other metabolic abnormalities as well as infectious etiology.  At time of evaluation she is normotensive, afebrile, nontachycardic, nonhypoxic.  Per discussion with family she does have advanced dementia  at this time.  She is DNR/DNI.  There is no report of recent fall or trauma.  Given abdominal discomfort will likely need scan to evaluate for cholecystitis versus nephrolithiasis versus appendicitis versus pancreatitis versus diverticulitis.  Less concerning for mesenteric ischemia given abdomen is not diffusely tender to palpation patient with no noted arrhythmia.      Plan:  Blood:  CBC/CMP/Mag/Lipase/Lactate  Urine:  UA/UCulture  Tests: EKG, Viral Panel, Stool Study  Imaging:  CT AbdPelvis  Interventions: Fluids, Pain Meds, Consider Abx  Consults Considered: GI      #Dispo: Observation  UPDATE: See ED course.  Patient presenting with weakness, nausea, diarrhea, abdominal pain.  Patient with dementia and unable to provide history.  She did obtain x-rays of both hands reporting concern for initial hand pain though without any fracture or dislocation.  Patient was CT of the abdomen and pelvis without evidence of obstruction or abscess.  Nonspecific finding of fat/inflammation though not concerning for incarceration at this time.  Furthermore patient without any significant abdominal tenderness to palpation.  Patient receiving fluid.  Patient initial lactate elevated prior to fluid resuscitation.  Awaiting stool sample though no persistent episodes of diarrhea while in the emergency department.  Urinalysis without evidence of infection.  Case discussed also with family member who reports that patient is slightly more fatigued and not quite at her baseline.  At this time we will plan for continued resuscitation and admission for observation until patient back at baseline.  Patient admitted to the hospitalist at this time.            Problems Addressed:  Weakness: complicated acute  illness or injury    Amount and/or Complexity of Data Reviewed  Independent Historian: caregiver  External Data Reviewed: labs and notes.  Labs: ordered. Decision-making details documented in ED Course.  Radiology: ordered and independent interpretation performed. Decision-making details documented in ED Course.  ECG/medicine tests:  Decision-making details documented in ED Course.    Risk  Prescription drug management.                            Raeanne Barry, MD  06/12/23 [phone]

## 2023-06-11 NOTE — ED Notes (Signed)
Pt incontinent of large amount of stool at this time. Pt cleaned, changed into new brief at this time.     Armstead Peaks, RN  06/11/23 1626

## 2023-06-11 NOTE — ED Notes (Signed)
Pt boosted and repositioned in stretcher.     Armstead Peaks, RN  06/11/23 2202

## 2023-06-12 LAB — BASIC METABOLIC PANEL
Anion Gap: 3 mmol/L (ref 3–14)
BUN: 25 mg/dL — ABNORMAL HIGH (ref 6–24)
CO2 (Bicarbonate): 26 mmol/L (ref 20–32)
Calcium: 8.9 mg/dL (ref 8.5–10.5)
Chloride: 112 mmol/L — ABNORMAL HIGH (ref 98–110)
Creatinine: 1.32 mg/dL — ABNORMAL HIGH (ref 0.55–1.30)
Glucose: 99 mg/dL (ref 70–110)
Potassium: 4.1 mmol/L (ref 3.6–5.2)
Sodium: 141 mmol/L (ref 135–146)
eGFRcr: 42 mL/min/{1.73_m2} — ABNORMAL LOW (ref >=60)

## 2023-06-12 LAB — CBC
Hematocrit: 35.9 % (ref 32.0–47.0)
Hemoglobin: 11.7 g/dL (ref 11.0–16.0)
MCH: 31 pg (ref 26.0–34.0)
MCHC: 32.6 g/dL (ref 31.0–37.0)
MCV: 95 fL (ref 80.0–100.0)
MPV: 8.7 fL — ABNORMAL LOW (ref 9.1–12.4)
NRBC %: 0 % (ref 0.0–0.0)
NRBC Absolute: 0 10*3/uL (ref 0.00–2.00)
Platelets: 221 10*3/uL (ref 150–400)
RBC: 3.78 M/uL (ref 3.70–5.20)
RDW-CV: 12.9 % (ref 11.5–14.5)
RDW-SD: 44.1 fL (ref 35.0–51.0)
WBC: 11.1 10*3/uL — ABNORMAL HIGH (ref 4.0–11.0)

## 2023-06-12 LAB — LACTIC ACID: Lactic acid: 1.6 mmol/L (ref 0.4–2.0)

## 2023-06-12 MED ORDER — OLANZapine (ZyPREXA) tablet 2.5 mg
2.5 | Freq: Two times a day (BID) | ORAL | Status: DC | PRN
Start: 2023-06-12 — End: 2023-06-14

## 2023-06-12 MED ORDER — OLANZapine (ZyPREXA) injection 2.5 mg
10 | Freq: Once | INTRAMUSCULAR | Status: AC
Start: 2023-06-12 — End: 2023-06-12
  Administered 2023-06-12: 23:00:00 2.5 mg via INTRAVENOUS

## 2023-06-12 MED ORDER — lactated Ringer's infusion
INTRAVENOUS | Status: AC
Start: 2023-06-12 — End: 2023-06-12
  Administered 2023-06-12 (×2): 100 mL/h via INTRAVENOUS

## 2023-06-12 MED ORDER — lactated Ringer's infusion
INTRAVENOUS | Status: AC
Start: 2023-06-12 — End: 2023-06-13
  Administered 2023-06-12: 21:00:00 50 mL/h via INTRAVENOUS

## 2023-06-12 MED FILL — OLANZAPINE 10 MG INTRAMUSCULAR SOLUTION: 10 10 mg | INTRAMUSCULAR | Qty: 2

## 2023-06-12 MED FILL — ROSUVASTATIN 20 MG TABLET: 20 20 mg | ORAL | Qty: 2

## 2023-06-12 MED FILL — HEPARIN (PORCINE) 5,000 UNIT/ML INJECTION SOLUTION: 5000 5,000 unit/mL | INTRAMUSCULAR | Qty: 1

## 2023-06-12 NOTE — Progress Notes (Addendum)
06/12/23 1542   Case Management Initial Assessment   Source of Information SNF/Rehab   Type of Residence Assisted Living   Patient Information   Support Systems Personal Care Attendant   Current Patient Responsibilities Personal Care   PTA Functional Status   Current Home Treatment/Devices/Equipment Rolling Walker   Current Functional Status   Bathing Moderate assist   Toileting Minimal Assist   Walking in Home Minimal Assist   Community Mobility Minimal Assist   Financial Information   Insurance Confirmed with Patient Yes   Legal Information   Does Patient Have HCP Yes   Discharge Planning   Patient/Family/Caregiver Discharge Preference Home w/ Services   Next Actions   Assessment/High Risk Screening Complete     78 year old female with past medical history of Hyperlipidemia, Hypertension and Dementia. Admitted with complaining of abdominal pain and diarrhea.  She also recently tested positive for COVID-19 infection on December 30.    Pt lives at [facility] . +HCP . Uses RW. Staff assist with adls. Coc to follow.  Winfield Cunas RN 06/12/2023 3:44 PM

## 2023-06-12 NOTE — ED Notes (Signed)
Pt constantly adjusted, takes off BP cuff, heart monitor leads and O2 monitor. IV still intact and wrapped.      Lissa Morales, RN  06/12/23 1156

## 2023-06-12 NOTE — ED Notes (Signed)
Pt eat 90% of her pancakes with assistance.      Lissa Morales, RN  06/12/23 1215

## 2023-06-12 NOTE — Care Plan (Signed)
Problem: Adult Inpatient Plan of Care  Goal: Plan of Care Review  Outcome: Ongoing, Progressing  Goal: Patient-Specific Goal (Individualized)  Outcome: Ongoing, Progressing  Goal: Absence of Hospital-Acquired Illness or Injury  Outcome: Ongoing, Progressing  Goal: Optimal Comfort and Wellbeing  Outcome: Ongoing, Progressing  Goal: Readiness for Transition of Care  Outcome: Ongoing, Progressing   Goal Outcome Evaluation:         Patient admitted to M3 at approximately 1500. Oriented to room and call light system. Vitals within normal limits. Patient completely disoriented. Denies pain. Incontinence care provided. No BM yet. Left peripheral IV removed due to leaking. Bed alarm set. Call light in reach. Room near nurses station. Daughter [name] visiting at bedside.     Bethann Goo, RN

## 2023-06-12 NOTE — ED Notes (Signed)
Pt repositioned for comfort, checked brief and dry and cleaned mouth with oral swabs.       Lissa Morales, RN  06/12/23 [phone]

## 2023-06-12 NOTE — Progress Notes (Signed)
MEDICAL PROGRESS NOTE    Name: Kim Blake  MRN: [id number]  Hospital Day: Hospital Day: 2    SUBJECTIVE:  Patient seen and examined.  Vitals, labs reviewed  Discussed with RN.    REVIEW OF SYSTEMS  A 12 point review of systems was performed and found to be negative except for what is documented in the progress note.    VITAL SIGNS & PHYSICAL EXAMINATION:  Current Vital Signs with 24 Hour Ranges:  Visit Vitals  BP 123/66 (Patient Position: Lying)   Pulse 79   Temp 36.3 C (97.4 F) (Oral)   Resp 25        PHYSICAL EXAMINATION  General: Patient looks comfortable  Confused  Eye: Pupils are equal  HENT: Oral mucosa is moist.  Neck: Supple, Non-tender  Respiratory: Respirations are non labored, bilateral equal breath sounds  Cardiovascular: Normal rate, Regular rhythm, s1 s2 appreciated  Gastrointestinal: Soft, Non-tender, Non-distended.  Genitourinary: No costovertebral angle tenderness  Musculoskeletal: moves extremities   integumentary: Warm        MEDICATIONS   heparin (porcine), 5,000 Units, subcutaneous, q8h SCH  rosuvastatin, 10 mg, oral, Daily      lactated Ringer's, 100 mL/hr, Last Rate: 100 mL/hr (06/12/23 1200)           LABORATORY : Reviewed    IMAGING : Reviewed.      Results from last 7 days   Lab Units 06/12/23  0402 06/11/23  1402   WBC AUTO K/uL 11.1* 19.6*   HEMOGLOBIN g/dL 69.6 29.5   HEMATOCRIT % 35.9 40.5   PLATELETS AUTO K/uL 221 283         Results from last 7 days   Lab Units 06/12/23  0402 06/11/23  1402   SODIUM mmol/L 141 140   POTASSIUM mmol/L 4.1 4.5   CHLORIDE mmol/L 112* 110   CO2 mmol/L 26 26   BUN mg/dL 25* 28*   CREATININE mg/dL 2.84* 1.32*   ANION GAP mmol/L 3 4     Results from last 7 days   Lab Units 06/12/23  0402 06/11/23  1402   CALCIUM mg/dL 8.9 9.4   MAGNESIUM mg/dL  --  2.0     Results from last 7 days   Lab Units 06/11/23  1402   ALT U/L 27   AST U/L 22   ALK PHOS U/L 131*   BILIRUBIN TOTAL mg/dL 0.4           No lab exists for component: "LABPROT"    Estimated Creatinine  Clearance: 36.9 mL/min (A) (by C-G formula based on SCr of 1.32 mg/dL (H)).    Imagings:  XR HAND RIGHT 1-2 VIEWS   Final Result      1. No acute fracture on radiograph.   2. Multilevel degenerative changes.         Delice Bison, MD 06/11/2023 3:21 PM      XR HAND LEFT 3+ VIEWS   Final Result   No acute osseous abnormality.         Darrow Bussing, MD 06/11/2023 3:47 PM      CT ABDOMEN PELVIS W CONTRAST   Final Result   Motion limited exam. No definite acute process identified in the abdomen or pelvis.       Partial effacement of the right femoral vein. There is adjacent fat density tissue which does not appear clearly defined. The etiology of this is uncertain as this cannot be clearly qualify as a femoral hernia  or other pathology such as nonencapsulated lipoma. Surgical consultation is suggested for clinical evaluation for hernia. MRI could also be performed for further evaluation on a nonemergent basis.      Darrow Bussing, MD 06/11/2023 3:39 PM            ASSESSMENT & PLAN:      COVID-19 infection  Patient's respiratory status is stable  She was prescribed Paxlovid on December 30     Diarrhea and abdominal pain  Could be in the setting of COVID-19 infection  Continue fluid resuscitation  Repeat lactate level improving  C. difficile, stool pathogen and fecal leukocytes ordered and pending.     AKI on CKD  Could be in the setting of dehydration  Continue fluid resuscitation and recheck BMP in the morning.  Improving     Syncope  Patient presented to the ER on December 30 after an unwitnessed fall however was sent back to the ED as per her daughter's request that time.  Start workup with orthostatic blood pressure.     Hypertension  Hold off on lisinopril and hydrochlorothiazide given AKI and dehydration     Hyperlipidemia  Rosuvastatin 40 mg daily     Left hand pain as reported to me by ER  Xray did not reveal anything acute     Microscopic proteinuria  Elevated alkaline phosphatase  Monocytosis  Outpatient  follow-up     Fat tissue density around right femoral vein.  CT scan could not differentiate between hernia or nonencapsulated lipoma.  This can be followed up as an outpatient with an MRI.  No incarceration as reported to ED physician by radiologist.     DVT prophylaxis: Heparin  GI prophylaxis: Not indicated  Diet: Regular diet    DNR/DNI    Chart reviewed. Images reviewed. Home and hospital medications reviewed.    Disclaimer: This note was generated with Dragon [employer] voice recognition program.  Please excuse any errors which may have been overlooked during review.  Sometimes, these errors may affect the content or meaning of a given sentence      Craig Staggers, MD  HOSPITALIST  [facility]

## 2023-06-12 NOTE — ED Notes (Signed)
Pt is unable to stand on her own without assistance, unable to obtain orthostatic blood pressures.      Lissa Morales, RN  06/12/23 [phone]

## 2023-06-12 NOTE — ED Notes (Signed)
Pt will only take oral medications when crushed and in apple sauces.     Lissa Morales, RN  06/12/23 0900

## 2023-06-13 LAB — CBC
Hematocrit: 33.3 % (ref 32.0–47.0)
Hemoglobin: 10.8 g/dL — ABNORMAL LOW (ref 11.0–16.0)
MCH: 31.3 pg (ref 26.0–34.0)
MCHC: 32.4 g/dL (ref 31.0–37.0)
MCV: 96.5 fL (ref 80.0–100.0)
MPV: 9.1 fL (ref 9.1–12.4)
NRBC %: 0 % (ref 0.0–0.0)
NRBC Absolute: 0 10*3/uL (ref 0.00–2.00)
Platelets: 194 10*3/uL (ref 150–400)
RBC: 3.45 M/uL — ABNORMAL LOW (ref 3.70–5.20)
RDW-CV: 12.8 % (ref 11.5–14.5)
RDW-SD: 45.1 fL (ref 35.0–51.0)
WBC: 7.7 10*3/uL (ref 4.0–11.0)

## 2023-06-13 LAB — BASIC METABOLIC PANEL
Anion Gap: 5 mmol/L (ref 3–14)
BUN: 17 mg/dL (ref 6–24)
CO2 (Bicarbonate): 24 mmol/L (ref 20–32)
Calcium: 8.4 mg/dL — ABNORMAL LOW (ref 8.5–10.5)
Chloride: 113 mmol/L — ABNORMAL HIGH (ref 98–110)
Creatinine: 1.26 mg/dL (ref 0.55–1.30)
Glucose: 90 mg/dL (ref 70–110)
Potassium: 3.5 mmol/L — ABNORMAL LOW (ref 3.6–5.2)
Sodium: 142 mmol/L (ref 135–146)
eGFRcr: 44 mL/min/{1.73_m2} — ABNORMAL LOW (ref >=60)

## 2023-06-13 LAB — MAGNESIUM: Magnesium: 1.8 mg/dL (ref 1.6–2.6)

## 2023-06-13 LAB — PHOSPHORUS: Phosphorus: 2.9 mg/dL (ref 2.4–4.9)

## 2023-06-13 MED ORDER — potassium chloride IVPB 10 mEq
10 | INTRAVENOUS | Status: AC
Start: 2023-06-13 — End: 2023-06-13
  Administered 2023-06-13 (×4): 10 meq via INTRAVENOUS

## 2023-06-13 MED ORDER — lactated Ringer's infusion
INTRAVENOUS | Status: AC
Start: 2023-06-13 — End: 2023-06-13
  Administered 2023-06-13: 17:00:00 50 mL/h via INTRAVENOUS

## 2023-06-13 MED FILL — POTASSIUM CHLORIDE 10 MEQ/100ML IN STERILE WATER INTRAVENOUS PIGGYBACK: 10 10 mEq/100 mL | INTRAVENOUS | Qty: 100

## 2023-06-13 MED FILL — HEPARIN (PORCINE) 5,000 UNIT/ML INJECTION SOLUTION: 5000 5,000 unit/mL | INTRAMUSCULAR | Qty: 1

## 2023-06-13 MED FILL — ROSUVASTATIN 20 MG TABLET: 20 20 mg | ORAL | Qty: 1

## 2023-06-13 NOTE — Consults (Signed)
SPEECH-LANGUAGE PATHOLOGY  INPATIENT SWALLOWING EVALUATION       Type of Session Clinical Swallowing Evaluation Visit # 1   Today's Date 06/13/23 Time Spent in Session 30 minutes   Date of Onset 06/11/23 Medical Diagnosis Abdominal pain   Start of Care 06/13/23 Treatment Diagnosis Rule-Out Dysphagia   Plan of Care Start Date  (NA) Plan of Care End Date  (NA)     I.  PATIENT HISTORY   History may be obtained from a variety of sources, including EMR, paper records, discussion with staff, patient, and family.  Brief Introduction:  Kim Blake is a 78 year old female presenting from facility with abdominal pain an diarrhea. Per family she does report that she saw patient 1 day prior and she was eating and drinking though seemed somewhat "off". Patient with recent presentation where she was found to have COVID and recently completed Paxlovid in the last several days.       Reason for Referral:  SLP consulted secondary to pt's confusion and pt's daughter reporting pt refusing to take meds.     Medical and Surgery History:  Past medical history significant for  Past Medical History:   Diagnosis Date    AAA (abdominal aortic aneurysm)     BP (high blood pressure)     CKD (chronic kidney disease), stage III (Multi-HCC)     Closed head injury     COVID-19     Cystitis     Dementia (Multi-HCC)     Depression     Fall     High cholesterol     Hypocalcemia     Insomnia     Iron deficiency     PNA (pneumonia)     Recurrent UTI     Renal impairment     Type 2 diabetes mellitus (Multi-HCC)     Vitamin D deficiency      Social / Educational History:  Pt is DNR/ DNI.    Prior Swallowing History:  Kim Blake is unknown to [facility] therapy department    Diet and Liquid Consistency PRIOR to admission:  Regular Consistency solids, Thin liquid    CXR and Other Relevant Diagnostic Information:  CT ABDOMEN PELVIS W CONTRAST 06/11/2023   IMPRESSION:  Motion limited exam. No definite acute process identified in the abdomen or  pelvis.   Partial effacement of the right femoral vein. There is adjacent fat density tissue which does not appear clearly defined. The etiology of this is uncertain as this cannot be clearly qualify as a femoral hernia or other pathology such as nonencapsulated lipoma. Surgical consultation is suggested for clinical evaluation for hernia. MRI could also be performed for further evaluation on a nonemergent basis.    CT CERVICAL SPINE WO CONTRAST 05/30/2023   IMPRESSION:  No acute fracture or subluxation. Moderate cervical spondylosis most marked at C5-6.    CT HEAD WO CONTRAST 05/30/2023   IMPRESSION:  1.  No acute intracranial abnormality is seen.  2.  Volume loss and microvascular ischemic change    RN Reports:  RN reported pt is very confused. No difficulty with PO at this time.     II.  EXAMINATION       A.  Current Status  Current Status  Orientation: Person  Follows Simple Commands Functionally: No  Expresses Needs Functionally: No  Respiratory Modality: Room Air       B.  Oral Mech Exam  Oral Mechanism unable to complete due to confusion  Dentition:  Natural dentition       C.  Consistencies Trialed  Consistencies Trialed  L0: Thin Liquids: x5 via cup  L4: Pureed/Extremely Thick: 3 oz of apple sauce  Regular: 0.5 inch of graham cracker     III.  ASSESSMENT       A.  INTRODUCTION AND OBSERVATIONS:  SLP received consult, completed chart review and spoke with RN. SLP received pt asleep in bed but woke from verbal stimuli. Pt confused but willing to participate in PO trials. Pt reported she was "tired." Pt self fed all trials. Results are as follows:          B.  ORAL PHASE  Labial seal was adequate without anterior loss. Lingual strength and ROM was adequate for bolus manipulation and propulsion. Anterior to posterior transit was timely. Mastication mildly prolonged although efficient bolus breakdown. Min oral stasis noted post swallow, cleared with liquids wash.         C. PHARYNGEAL PHASE  Pt without overt s/sx  pharyngeal struggle across all consistencies. Swallow trigger appeared prompt. Hyolaryngeal elevation and excursion present but suspected to be reduced in ROM. No congestion or SOB noted. Vocal quality remained clear.         D.  OVERALL SUMMARY & IMPRESSIONS  Kim Blake presents with grossly functional oral and pharyngeal phases of the swallow at bedside. Oral phase efficient for bolus manipulation and propulsion. Pt without s/sx of pharyngeal struggle. SLP recommends pt continue on Regular solids and L0 Thin liquids. Pt recommended close supervision as needed with PO due to confusion. SLP suspects as pt becomes more confused her swallow function is effected. Please only allow PO when pt is awake and willing to accept. SLP services not indicated at this time.      IV.  RECOMMENDATIONS:  1. Recommended Solid Consistency: CONTINUE ON, Regular Consistency   2. Recommended Liquid Consistency: CONTINUE ON, L0: Thin Liquids   3. Administer Liquids Via: Per patient preference   4. Administer Medications (with MD/PharmD Approval): Regular Pills as Tolerated   5. Compensatory Techniques: Ensure Swallow Before Next Bite, Alternate Solids & Liquids, Small Bites   6. Positional Techniques: Upright 90 Degrees for All Meals   7. Supervision: Close Supervision in Room   8. MD May Wish to Consider Consults to:: Defer to PCP   9. Mouthcare: Mouthcare at a minimum of 3x Daily with a toothbrush & Toothpaste if Possible   10 Modified Barium Swallow Recommendations: Does not appear to be clinically indicated at this time     Education  Results, Recommendations, & Plan Discussed with: Patient  Education Provided Regarding: Plan of Care, Thin Liquids Recommendations, Food Consistency Recommendations  Method of Education: Discussion      V.  PLAN OF CARE  Is Skilled SLP Therapy Recommended: No  Frequency of Treatment: Discharge from skilled services  Duration of Treatment: Discharge from skilled services     VI.  GOALS  Long Term Goal   NA    Short Term Objective   NA              PLAN for Next Session  Speech Services not indicated at this time.

## 2023-06-13 NOTE — Progress Notes (Signed)
06/12/23 1542   Case Management Initial Assessment   Source of Information SNF/Rehab   Type of Residence Assisted Living   Patient Information   Support Systems Personal Care Attendant   Current Patient Responsibilities Personal Care   PTA Functional Status   Current Home Treatment/Devices/Equipment Rolling Walker   Current Functional Status   Bathing Moderate assist   Toileting Minimal Assist   Walking in Home Minimal Assist   Community Mobility Minimal Assist   Financial Information   Insurance Confirmed with Patient Yes   Legal Information   Does Patient Have HCP Yes   Discharge Planning   Patient/Family/Caregiver Discharge Preference Home w/ Services   Next Actions   Assessment/High Risk Screening Complete     78 year old female with past medical history of Hyperlipidemia, Hypertension and Dementia. Admitted with complaining of abdominal pain and diarrhea.  She also recently tested positive for COVID-19 infection on December 30.    Pt lives at [facility] at [facility] . +HCP . Uses RW. Staff assist with adls. Coc to follow.  Winfield Cunas RN 06/12/2023 3:44 PM     Case Management Progress Note: CM spoke with dtr about safe DC planing. She was walking to the kitchen previously , has refused meds before. Anticipate return to [facility] when medically cleared.   Lynne Logan, RN  06/13/2023  12:43 PM

## 2023-06-13 NOTE — Progress Notes (Signed)
MEDICAL PROGRESS NOTE    Name: Kim Blake  MRN: [id number]  Hospital Day: Hospital Day: 3    SUBJECTIVE:  Patient seen and examined.  Vitals, labs reviewed  Discussed with RN.  Diarrhea improving    REVIEW OF SYSTEMS  A 12 point review of systems was performed and found to be negative except for what is documented in the progress note.    VITAL SIGNS & PHYSICAL EXAMINATION:  Current Vital Signs with 24 Hour Ranges:  Visit Vitals  BP 134/65 (BP Location: Right arm, Patient Position: Lying)   Pulse 71   Temp 37.1 C (98.7 F) (Oral)   Resp 18        PHYSICAL EXAMINATION  General: Patient looks comfortable  Confused  Eye: Pupils are equal  HENT: Oral mucosa is moist.  Neck: Supple, Non-tender  Respiratory: Respirations are non labored, bilateral equal breath sounds  Cardiovascular: Normal rate, Regular rhythm, s1 s2 appreciated  Gastrointestinal: Soft, Non-tender, Non-distended.  Genitourinary: No costovertebral angle tenderness  Musculoskeletal: moves extremities   integumentary: Warm        MEDICATIONS   heparin (porcine), 5,000 Units, subcutaneous, q8h [facility]  potassium chloride, 10 mEq, intravenous, q1h  rosuvastatin, 10 mg, oral, Daily      lactated Ringer's, 50 mL/hr, Last Rate: 50 mL/hr (06/13/23 1142)      PRN medications: OLANZapine     LABORATORY : Reviewed    IMAGING : Reviewed.      Results from last 7 days   Lab Units 06/13/23  0628 06/12/23  0402 06/11/23  1402   WBC AUTO K/uL 7.7 11.1* 19.6*   HEMOGLOBIN g/dL 95.6* 21.3 08.6   HEMATOCRIT % 33.3 35.9 40.5   PLATELETS AUTO K/uL 194 221 283         Results from last 7 days   Lab Units 06/13/23  0628 06/12/23  0402 06/11/23  1402   SODIUM mmol/L 142 141 140   POTASSIUM mmol/L 3.5* 4.1 4.5   CHLORIDE mmol/L 113* 112* 110   CO2 mmol/L 24 26 26    BUN mg/dL 17 25* 28*   CREATININE mg/dL 5.78 4.69* 6.29*   ANION GAP mmol/L 5 3 4      Results from last 7 days   Lab Units 06/13/23  0628 06/12/23  0402 06/11/23  1402   CALCIUM mg/dL 8.4* 8.9 9.4   MAGNESIUM mg/dL  1.8  --  2.0   PHOSPHORUS mg/dL 2.9  --   --      Results from last 7 days   Lab Units 06/11/23  1402   ALT U/L 27   AST U/L 22   ALK PHOS U/L 131*   BILIRUBIN TOTAL mg/dL 0.4           No lab exists for component: "LABPROT"    Estimated Creatinine Clearance: 38.7 mL/min (by C-G formula based on SCr of 1.26 mg/dL).    Imagings:  XR HAND RIGHT 1-2 VIEWS   Final Result      1. No acute fracture on radiograph.   2. Multilevel degenerative changes.         Delice Bison, MD 06/11/2023 3:21 PM      XR HAND LEFT 3+ VIEWS   Final Result   No acute osseous abnormality.         Darrow Bussing, MD 06/11/2023 3:47 PM      CT ABDOMEN PELVIS W CONTRAST   Final Result   Motion limited exam. No definite acute process  identified in the abdomen or pelvis.       Partial effacement of the right femoral vein. There is adjacent fat density tissue which does not appear clearly defined. The etiology of this is uncertain as this cannot be clearly qualify as a femoral hernia or other pathology such as nonencapsulated lipoma. Surgical consultation is suggested for clinical evaluation for hernia. MRI could also be performed for further evaluation on a nonemergent basis.      Darrow Bussing, MD 06/11/2023 3:39 PM            ASSESSMENT & PLAN:      COVID-19 infection  Patient's respiratory status is stable  She was prescribed Paxlovid on December 30     Diarrhea and abdominal pain  Could be in the setting of COVID-19 infection  Continue fluid resuscitation  Repeat lactate level improving  C. difficile, stool pathogen and fecal leukocytes ordered and pending.    Speech pathology consult for swallow evaluation     AKI on CKD  Could be in the setting of dehydration  Continue fluid resuscitation and recheck BMP in the morning.  Improving    Hypokalemia repleted     Syncope  Patient presented to the ER on December 30 after an unwitnessed fall however was sent back to the ED as per her daughter's request that time.   orthostatic blood pressure.   Ordered.  Pending     Hypertension  Hold off on lisinopril and hydrochlorothiazide given AKI and dehydration     Hyperlipidemia  Rosuvastatin 40 mg daily     Left hand pain as reported to me by ER  Xray did not reveal anything acute     Microscopic proteinuria  Elevated alkaline phosphatase  Monocytosis  Outpatient follow-up     Fat tissue density around right femoral vein.  CT scan could not differentiate between hernia or nonencapsulated lipoma.  This can be followed up as an outpatient with an MRI.  No incarceration as reported to ED physician by radiologist.     DVT prophylaxis: Heparin  GI prophylaxis: Not indicated      DNR/DNI    Chart reviewed. Images reviewed. Home and hospital medications reviewed.    Disclaimer: This note was generated with Dragon Medical voice recognition program.  Please excuse any errors which may have been overlooked during review.  Sometimes, these errors may affect the content or meaning of a given sentence      Craig Staggers, MD  HOSPITALIST  [facility]

## 2023-06-14 LAB — BASIC METABOLIC PANEL
Anion Gap: 6 mmol/L (ref 3–14)
BUN: 13 mg/dL (ref 6–24)
CO2 (Bicarbonate): 24 mmol/L (ref 20–32)
Calcium: 9 mg/dL (ref 8.5–10.5)
Chloride: 112 mmol/L — ABNORMAL HIGH (ref 98–110)
Creatinine: 1.24 mg/dL (ref 0.55–1.30)
Glucose: 84 mg/dL (ref 70–110)
Potassium: 4.2 mmol/L (ref 3.6–5.2)
Sodium: 142 mmol/L (ref 135–146)
eGFRcr: 45 mL/min/{1.73_m2} — ABNORMAL LOW (ref >=60)

## 2023-06-14 LAB — LAVENDER TOP

## 2023-06-14 MED ORDER — rosuvastatin (Crestor) 10 mg tablet
10 | ORAL_TABLET | Freq: Every day | ORAL | 0 refills | Status: AC
Start: 2023-06-14 — End: 2023-06-22

## 2023-06-14 MED ORDER — ferrous sulfate 160 (50 Fe) mg ER tablet
160 | ORAL_TABLET | Freq: Every day | ORAL | 0 refills | Status: AC
Start: 2023-06-14 — End: 2023-06-21

## 2023-06-14 MED ORDER — docusate sodium (Colace) 100 mg tablet
100 | ORAL_TABLET | Freq: Every day | ORAL | 0 refills | Status: AC
Start: 2023-06-14 — End: 2023-06-21

## 2023-06-14 MED ORDER — lisinopril 20 mg tablet
20 | ORAL_TABLET | Freq: Every morning | ORAL | 0 refills | Status: AC
Start: 2023-06-14 — End: 2023-06-21

## 2023-06-14 MED ORDER — nutritional drink (Ensure High Protein) liquid
Freq: Two times a day (BID) | ORAL | 0 refills | 30.00000 days | Status: AC
Start: 2023-06-14 — End: 2023-06-21

## 2023-06-14 MED ORDER — calcium carbonate 500 mg calcium (1,250 mg) tablet
500 | ORAL_TABLET | Freq: Every day | ORAL | 0 refills | Status: AC
Start: 2023-06-14 — End: ?

## 2023-06-14 MED ORDER — rosuvastatin (Crestor) 10 mg tablet
10 | ORAL_TABLET | Freq: Every day | ORAL | 0 refills | Status: DC
Start: 2023-06-14 — End: 2023-06-14

## 2023-06-14 MED ORDER — melatonin 3 mg tablet,disintegrating
3 | ORAL_TABLET | Freq: Every evening | ORAL | 0 refills | Status: AC
Start: 2023-06-14 — End: 2023-06-21

## 2023-06-14 MED FILL — HEPARIN (PORCINE) 5,000 UNIT/ML INJECTION SOLUTION: 5000 5,000 unit/mL | INTRAMUSCULAR | Qty: 1

## 2023-06-14 NOTE — Progress Notes (Signed)
06/12/23 1542   Case Management Initial Assessment   Source of Information SNF/Rehab   Type of Residence Assisted Living   Patient Information   Support Systems Personal Care Attendant   Current Patient Responsibilities Personal Care   PTA Functional Status   Current Home Treatment/Devices/Equipment Rolling Walker   Current Functional Status   Bathing Moderate assist   Toileting Minimal Assist   Walking in Home Minimal Assist   Community Mobility Minimal Assist   Financial Information   Insurance Confirmed with Patient Yes   Legal Information   Does Patient Have HCP Yes   Discharge Planning   Patient/Family/Caregiver Discharge Preference Home w/ Services   Next Actions   Assessment/High Risk Screening Complete     78 year old female with past medical history of Hyperlipidemia, Hypertension and Dementia. Admitted with complaining of abdominal pain and diarrhea.  She also recently tested positive for COVID-19 infection on December 30.    Pt lives at [facility] at [facility] . +HCP . Uses RW. Staff assist with adls. Coc to follow.  Winfield Cunas RN 06/12/2023 3:44 PM     Case Management Progress Note: CM spoke with dtr about safe DC planing. She was walking to the kitchen previously , has refused meds before. Anticipate return to [facility] when medically cleared.   Lynne Logan, RN  06/13/2023  12:43 PM    06/14/23 Case Management Progress Note: per am rounds pt continues on IVF, PT eval rec STR. She is off precautions now for CDiff and COVID.  Taking po.  S/W dtr [name] regarding d/c dispo and back to [facility] vs SNF. Told by TMP CM that [facility] can offer bed.  Asked to book transport and did this for 5 pm today.  [employer] asked me to cancel transport because they need ins auth.  Transport cancelled and d/c is pending auth.  COC to continue to follow for d/c plan.  Melvyn Neth, RN  06/14/2023  1:42 PM

## 2023-06-14 NOTE — Consults (Signed)
Physical Therapy Evaluation    Patient Name: Kim Blake  MRN: [id number]  DOB: Dec 11, 1945  Evaluation Date: 06/14/2023    Subjective   HPI/Hospital Course:   Patient is a(n) 78 year old female who was admitted 1/11 due to N/V/D, abdominal pain and weakness. Also c/o L wrist pain, imaging neg. Recent covid infection and syncope 12/30. Ddx diarrhea and abd pain in detting of prev covid infection. PMH sig for Hyperlipidemia, Hypertension and Dementia       Patient Active Problem List   Diagnosis    Agitation    Alcohol withdrawal delirium (Multi-HCC)    Weakness       Past Medical History:   Diagnosis Date    AAA (abdominal aortic aneurysm)     BP (high blood pressure)     CKD (chronic kidney disease), stage III (Multi-HCC)     Closed head injury     COVID-19     Cystitis     Dementia (Multi-HCC)     Depression     Fall     High cholesterol     Hypocalcemia     Insomnia     Iron deficiency     PNA (pneumonia)     Recurrent UTI     Renal impairment     Type 2 diabetes mellitus (Multi-HCC)     Vitamin D deficiency        Past Surgical History:   Procedure Laterality Date    HYSTERECTOMY         Objective     General Information  General Info  PT Received On: 06/14/23  Interpreter: Video Interpreter (primarily unsuccessful with use of interperter)  Following Therapy Session:: Call bell within reach, Patient in Bed, Bed Alarm Activated, Nursing Staff Aware of Patient Location  Plan of Care Reviewed With:: Patient, RN/Charge Nurse  Recommended mobility with nursing staff: 2A stand step transfer with RW/HHA  Eval Information  Type of Evaluation: Initial Evaluation    Precautions  Other Precautions: Fall Risk, Delirium Risk    Home Living  Was Patient Admitted from STR?: No  Lives With: Alone  Type of Home: Assisted Living (memory care unit)  Home Layout: One Level  Number of Stairs to Enter Home: 0  Number of Stairs Within Home: 0  Home Equipment: Agricultural consultant  ADL/IADL Equipment: Paediatric nurse, Engineer, materials Around  Pensions consultant, Engineer, materials in Air traffic controller  Comments: per EMR, pt usually ambulatory with RW    Prior Level of Function  Information Provided By: EMR  Independent at Baseline: All Household Mobility, Ambulation, With Device  Needs Assistance with at Baseline: All ADL's, All IADL's  History of Recent Falls: Yes    Pain  Pain Assessment: Wong-Baker FACES  Wong-Baker FACES Pain Rating: No hurt       Vital Signs       Cognition  Overall Cognitive Status: Impaired  Orientation Level: Unable to assess  Following Commands: Difficulty following one step commands  Safety Judgment: Decreased Safety awareness  Awareness of Errors/Deficits: Impaired  Attention Span: Impaired  Memory: Impaired  Problem Solving: Impaired  Behavioral/Affect Comments: pt appears confused, difficulty following direction despite use of interpreter, demo, VC/TC. often looking around, poor attention to task or safety awareness  Cognition Treatment Details: pt primarily prefer to be sidelying in fetal position in bed. attempted to transfer pt OOB to recliner where she resumed same position therefore for safety returned to bed.    Integumentary  Integumentary: PIV RUE, various UE  bruising, visible skin appears intact  LDA Integrity: Intact Pre and Post Therapy    ROM/Strength  PROM Right Lower Extremity  Overall RLE PROM: WFL  PROM Left Lower Extremity  Overall LLE PROM: [facility]  Strength Right Lower Extremity  RLE Overall Strength: Other (Comment) (grossly 3/5 based on observation of functional mobility. unable to perform formal MMT due to impaired cogition)  Strength Left Lower Extremity  LLE Overall Strength: Other (Comment) (grossly 3/5 based on observation of functional mobility. unable to perform formal MMT due to impaired cogition)    Neuromuscular Assessment            Mobility Assessment  Bed Position: HOB flat, Use of bedrail  Roll Left: Moderate Assist  Roll Right: Moderate Assist  Supine to Sit: Moderate Assist  Sit to Supine: Moderate Assist  Scooting: Minimal  Assist  Bed Mobility Comments: required max VC/TC to complete bed mobility. rolled L and R for linen and brief changed. assist at BLE and trunk to sit up EOB. pt often attempting to lay back down in bed    Sit to Stand: Moderate Assist, X2 Assists  Stand to Sit: Moderate Assist, X2 Assists  Bed to Chair: Moderate Assist, X2 Assists  Chair to Bed: Moderate Assist, X2 Assists  Assistive Device: Rolling Walker, None  Transfers Comments: pt stood with max cues and assist to RW. once in standing, able to take a few steps forwards with CGA however decreased safety awareness/STM, moving RW away and reaching for stuff on bed rail. requried max VC/TC to redirect pt to sit in chair. poor safety awareness and eccentric control when returning to sitting EOB or in recliner chair, pivoting hips and requiring mod A for safety. once in recliner chair, pt immediately returned to fetal position in chair. for pt comfort and safety opted to return to bed instead due to risk of pt sliding out of chair in side lying.                   Sitting Balance - Static: fair- BUE support  Standing Balance - Static: fair with BUE support  [employer] Balance Scale: Yes  KU Sitting Balance Scale: 1+ - Supports self with upper extremities but requires therapist assistance. Patient performs greater than 50% of effort (minimal assist).  KU Standing Balance Scale: 1+ - Supports self with upper extremities but requires therapist assistance. Patient performs greater than 50% of effort (minimal assist).    AMPAC - (Basic Mobility Inpatient Short Form) How much help from another person do you currently need?  Turning from your back to your side while in a flat bed without using bedrails?: 2 - A Lot  Moving from lying on your back to sitting on the side of a flat bed without using bedrails?: 2 - A Lot  Moving to and from a bed to a chair (including a wheelchair)?: 2 - A Lot  Standing up from a chair using your arms (e.g., wheelchair, or bedside  chair)?: 2 - A Lot  To walk in hospital room?: 2 - A Lot  Climbing 3-5 steps with a railing?: 2 - A Lot  Raw Score: 12         Education  Education Provided: Role of PT/Plan of Care, Discharge Planning, Adaptive Equipment/Assistive Device, Development worker, community, Safety Awareness/Fall Risk, Activities Guidelines, Estate manager/land agent  Education Provided to: Patient  Teaching Method: Discussion, Demonstration  Barriers to learning: Acuity of illness, Cognitive deficits, Language barrier  Learning Evaluation: Needs  practice, Needs reinforcement/further teaching    Assessment   Patient seen for PT eval on this date. Patient appears confused, withdrawn, and often preferring side lying/fetal position in bed. Required mod A x 2 for bed mobility and transfer OOB to recliner chair. Patient often distracted with poor safety awareness, letting go of RW and attempting to sit prematurely . In recliner chair, pt positioned herself into side lying again. For pt safety, returned back to bed. Anticipate pts mobility also dependent on her affect/goals; may be able to ambulate further if she chooses, however, currently and frequently prefers to be sidelying in bed. Per EMR, mobilizing below baseline and would recommend STR. RN updated. Will continue to follow while in house.       Patient presents with Impaired gait, Functional Mobility deficit, Impaired activity tolerance, Impaired safety awareness, Balance deficit, Strength deficit, Impaired cognition       Prognosis: Fair       Goals   Short Term Goals  Date Established/Amended: 06/14/23  Goal timeframe: 2 week  Bed mobility goal: I w/ bed mobility  Transfers goal: S all transfers w/ LRAD  Ambulation goal: amb .178ft w/ LRAD and close supervision         Plan   Plan: Continued Skilled Inpatient PT Services   Treatment Interventions: Therapeutic exercise, Assistive device training, Gait training, Patient/family education, Neuromuscular Re-education/balance  PT Frequency and Duration: 3-5  x/week until Discharge  Treatment:      Recommendations  Anticipate Discharge to: Rehab  Recommended mobility with nursing staff: 2A stand step transfer with RW/HHA  Landis Martins, PT  [place] lic # [id number]

## 2023-06-14 NOTE — Discharge Summary (Signed)
[facility] - [address], [place] [address]    DISCHARGE SUMMARY      Date of admission:  06/11/2023   Date of discharge:   06/14/2023        PCP:  Richardean Sale, MD    Principal diagnosis:Dehydration    Other actively managed diagnoses:  Hyperlipidemia  Hypertension  Dementia    Discharge condition: Stable    Disposition: Rehab    Issues to be addressed post-discharge:  Hyperlipidemia  Hypertension  Dementia    Code status: DNR/DNI    ________________________________________________________    Hospital Course:     78 year old female with past medical history of     Hyperlipidemia  Hypertension  Dementia     Presented to the emergency department complaining of abdominal pain and diarrhea.  She also recently tested positive for COVID-19 infection on December 30.  There was a concern for dehydration in the ED for which she received 2 L of LR bolus, stool studies were ordered and medicine was paged for admission.     Per ED signout, daughter noticed patient to be "off" for the past few days. She also reported some hand pain on the left side.      Case discussed with ED provider.     Patient is very confused I believe from her dementia.  In spite of arranging for an interpreter, she was not really able to participate in the conversation.  She was awake though.    ASSESSMENT & PLAN:        COVID-19 infection  Patient's respiratory status is stable  She was prescribed Paxlovid on December 30  1/14-Resolved     Diarrhea and abdominal pain  Could be in the setting of COVID-19 infection  Continue fluid resuscitation  Repeat lactate level improving  C. difficile, stool pathogen and fecal leukocytes ordered and pending.  1/14-Stool Cultures -Negative     Speech pathology consult for swallow evaluation     AKI on CKD  Could be in the setting of dehydration  Continue fluid resuscitation and recheck BMP in the morning.  Improving  1/14-BUN-13/CR-1.24     Hypokalemia repleted  1/14-K-4.2     Syncope  Patient  presented to the ER on December 30 after an unwitnessed fall however was sent back to the ED as per her daughter's request that time.   orthostatic blood pressure.  Ordered.  Pending  1/14-167/80     Hypertension  Hold off on lisinopril and hydrochlorothiazide given AKI and dehydration  1/14-167/80     Hyperlipidemia  1/14-Rosuvastatin 40 mg daily     Left hand pain as reported to me by ER  Xray did not reveal anything acute  1/14-Negative     Microscopic proteinuria  Elevated alkaline phosphatase  Monocytosis  1/14-Outpatient follow-up     Fat tissue density around right femoral vein.  CT scan could not differentiate between hernia or nonencapsulated lipoma.  This can be followed up as an outpatient with an MRI.  No incarceration as reported to ED physician by radiologist.  1/14-OP F/U     DVT prophylaxis: Heparin  1/14-Encourage ambulation   PT Recommended Rehab    GI prophylaxis: Not indicated        DNR/DNI     Chart reviewed. Images reviewed. Home and hospital medications reviewed.       Allergies  No Known Allergies    Procedures:  XR HAND RIGHT 1-2 VIEWS   Final Result  1. No acute fracture on radiograph.   2. Multilevel degenerative changes.         Delice Bison, MD 06/11/2023 3:21 PM      XR HAND LEFT 3+ VIEWS   Final Result   No acute osseous abnormality.         Darrow Bussing, MD 06/11/2023 3:47 PM      CT ABDOMEN PELVIS W CONTRAST   Final Result   Motion limited exam. No definite acute process identified in the abdomen or pelvis.       Partial effacement of the right femoral vein. There is adjacent fat density tissue which does not appear clearly defined. The etiology of this is uncertain as this cannot be clearly qualify as a femoral hernia or other pathology such as nonencapsulated lipoma. Surgical consultation is suggested for clinical evaluation for hernia. MRI could also be performed for further evaluation on a nonemergent basis.      Darrow Bussing, MD 06/11/2023 3:39 PM             Consultants:        C. PHARYNGEAL PHASE  Pt without overt s/sx pharyngeal struggle across all consistencies. Swallow trigger appeared prompt. Hyolaryngeal elevation and excursion present but suspected to be reduced in ROM. No congestion or SOB noted. Vocal quality remained clear.          D.  OVERALL SUMMARY & IMPRESSIONS  Ms. Kabala presents with grossly functional oral and pharyngeal phases of the swallow at bedside. Oral phase efficient for bolus manipulation and propulsion. Pt without s/sx of pharyngeal struggle. SLP recommends pt continue on Regular solids and L0 Thin liquids. Pt recommended close supervision as needed with PO due to confusion. SLP suspects as pt becomes more confused her swallow function is effected. Please only allow PO when pt is awake and willing to accept. SLP services not indicated at this time.       IV.  RECOMMENDATIONS:  1. Recommended Solid Consistency: CONTINUE ON, Regular Consistency   2. Recommended Liquid Consistency: CONTINUE ON, L0: Thin Liquids   3. Administer Liquids Via: Per patient preference   4. Administer Medications (with MD/PharmD Approval): Regular Pills as Tolerated   5. Compensatory Techniques: Ensure Swallow Before Next Bite, Alternate Solids & Liquids, Small Bites   6. Positional Techniques: Upright 90 Degrees for All Meals   7. Supervision: Close Supervision in Room   8. MD May Wish to Consider Consults to:: Defer to PCP   9. Mouthcare: Mouthcare at a minimum of 3x Daily with a toothbrush & Toothpaste if Possible   10 Modified Barium Swallow Recommendations: Does not appear to be clinically indicated at this time      Education  Results, Recommendations, & Plan Discussed with: Patient  Education Provided Regarding: Plan of Care, Thin Liquids Recommendations, Food Consistency Recommendations  Method of Education: Discussion      V.  PLAN OF CARE  Is Skilled SLP Therapy Recommended: No  Frequency of Treatment: Discharge from skilled services  Duration of  Treatment: Discharge from skilled services         Pertinent lab findings and imaging:  Admission on 06/11/2023   Component Date Value Ref Range Status    Sodium 06/11/2023 140  135 - 146 mmol/L Final    Potassium 06/11/2023 4.5  3.6 - 5.2 mmol/L Final    Chloride 06/11/2023 110  98 - 110 mmol/L Final    CO2 (Bicarbonate) 06/11/2023 26  20 - 32 mmol/L Final  Anion Gap 06/11/2023 4  3 - 14 mmol/L Final    BUN 06/11/2023 28 (H)  6 - 24 mg/dL Final    Creatinine 40/98/1191 1.68 (H)  0.55 - 1.30 mg/dL Final    eGFRcr 47/82/9562 31 (L)  >=60 mL/min/1.52m*2 Final    Glucose 06/11/2023 139 (H)  70 - 110 mg/dL Final    Fasting? 13/12/6576 Unknown   Final    Calcium 06/11/2023 9.4  8.5 - 10.5 mg/dL Final    AST 46/96/2952 22  6 - 42 U/L Final    ALT 06/11/2023 27  0 - 55 U/L Final    Alkaline phosphatase 06/11/2023 131 (H)  30 - 130 U/L Final    Protein, total 06/11/2023 7.6  6.0 - 8.4 g/dL Final    Albumin 84/13/2440 4.0  3.2 - 5.0 g/dL Final    Bilirubin, total 06/11/2023 0.4  0.2 - 1.2 mg/dL Final    WBC 03/27/2535 19.6 (H)  4.0 - 11.0 K/uL Final    RBC 06/11/2023 4.31  3.70 - 5.20 M/uL Final    Hemoglobin 06/11/2023 13.6  11.0 - 16.0 g/dL Final    Hematocrit 64/40/3474 40.5  32.0 - 47.0 % Final    MCV 06/11/2023 94.0  80.0 - 100.0 fL Final    MCH 06/11/2023 31.6  26.0 - 34.0 pg Final    MCHC 06/11/2023 33.6  31.0 - 37.0 g/dL Final    RDW-CV 25/95/6387 12.5  11.5 - 14.5 % Final    RDW-SD 06/11/2023 43.8  35.0 - 51.0 fL Final    Platelets 06/11/2023 283  150 - 400 K/uL Final    MPV 06/11/2023 8.7 (L)  9.1 - 12.4 fL Final    NRBC % 06/11/2023 0.0  0.0 - 0.0 % Final    NRBC Absolute 06/11/2023 0.00  0.00 - 2.00 K/uL Final    Color, Ur 06/11/2023 Yellow  Yellow, Dark Yellow Final    Clarity, Ur 06/11/2023 Clear  Clear Final    Specific Gravity, Ur 06/11/2023 >=1.030  1.005 - 1.030 Final    pH, Ur 06/11/2023 6.0  5.0 - 8.0 Final    Protein,Ur 06/11/2023 30 (A)  Negative mg/dL Final    Glucose,Ur 56/43/3295 Negative  Negative  mg/dL Final    Ketones, Ur 18/84/1660 Negative  Negative mg/dL Final    Bilirubin, Ur 06/11/2023 Negative  Negative Final    Blood, Ur 06/11/2023 Negative  Negative Final    Urobilinogen, Ur 06/11/2023 0.2  0.2-1.0 E.U./dl E.U./dl Final    Nitrite, Ur 63/05/6008 Negative  Negative Final    Leukocyte Esterase, Ur 06/11/2023 Negative  Negative WBC/uL Final    RBC, Ur 06/11/2023 1  0-4 cells/HPF cells/HPF Final    WBC, Ur 06/11/2023 1  0-5 cells/HPF cells/HPF Final    Epithelial Cells, UR 06/11/2023 3  0-5 cells/HPF cells/HPF Final    Bacteria, Ur 06/11/2023 None Seen  None Seen Final    Casts, Ur 06/11/2023 0  0-4 cells/LPF cells/LPF Final    Lipase 06/11/2023 60  13 - 75 U/L Final    Magnesium 06/11/2023 2.0  1.6 - 2.6 mg/dL Final    Neutrophils % 06/11/2023 90  % Final    Lymphocytes % 06/11/2023 4  % Final    Monocytes % 06/11/2023 6  % Final    Neutrophils Absolute 06/11/2023 17.64 (H)  1.50 - 7.95 K/uL Final    Lymphocytes Absolute 06/11/2023 0.78  0.70 - 4.00 K/uL Final    Monocytes Absolute 06/11/2023 1.18 (  H)  0.36 - 0.77 K/uL Final    RBC Morphology 06/11/2023 Normal  Normal, Not Performed Final    PLT Morphology 06/11/2023 Normal  Normal, Not Performed Final    Total WBC Counted 06/11/2023 100   Final    Lactic acid 06/11/2023 3.8 (H)  0.4 - 2.0 mmol/L Final    Lactic acid 06/11/2023 3.7 (H)  0.4 - 2.0 mmol/L Final    Lactic acid 06/12/2023 1.6  0.4 - 2.0 mmol/L Final    Sodium 06/12/2023 141  135 - 146 mmol/L Final    Potassium 06/12/2023 4.1  3.6 - 5.2 mmol/L Final    Chloride 06/12/2023 112 (H)  98 - 110 mmol/L Final    CO2 (Bicarbonate) 06/12/2023 26  20 - 32 mmol/L Final    Anion Gap 06/12/2023 3  3 - 14 mmol/L Final    BUN 06/12/2023 25 (H)  6 - 24 mg/dL Final    Creatinine 65/78/4696 1.32 (H)  0.55 - 1.30 mg/dL Final    eGFRcr 29/52/8413 42 (L)  >=60 mL/min/1.31m*2 Final    Glucose 06/12/2023 99  70 - 110 mg/dL Final    Fasting? 24/40/1027 Unknown   Final    Calcium 06/12/2023 8.9  8.5 - 10.5 mg/dL  Final    WBC 25/36/6440 11.1 (H)  4.0 - 11.0 K/uL Final    RBC 06/12/2023 3.78  3.70 - 5.20 M/uL Final    Hemoglobin 06/12/2023 11.7  11.0 - 16.0 g/dL Final    Hematocrit 34/74/2595 35.9  32.0 - 47.0 % Final    MCV 06/12/2023 95.0  80.0 - 100.0 fL Final    MCH 06/12/2023 31.0  26.0 - 34.0 pg Final    MCHC 06/12/2023 32.6  31.0 - 37.0 g/dL Final    RDW-SD 63/87/5643 44.1  35.0 - 51.0 fL Final    RDW-CV 06/12/2023 12.9  11.5 - 14.5 % Final    Platelets 06/12/2023 221  150 - 400 K/uL Final    MPV 06/12/2023 8.7 (L)  9.1 - 12.4 fL Final    NRBC % 06/12/2023 0.0  0.0 - 0.0 % Final    NRBC Absolute 06/12/2023 0.00  0.00 - 2.00 K/uL Final    Sodium 06/13/2023 142  135 - 146 mmol/L Final    Potassium 06/13/2023 3.5 (L)  3.6 - 5.2 mmol/L Final    Chloride 06/13/2023 113 (H)  98 - 110 mmol/L Final    CO2 (Bicarbonate) 06/13/2023 24  20 - 32 mmol/L Final    Anion Gap 06/13/2023 5  3 - 14 mmol/L Final    BUN 06/13/2023 17  6 - 24 mg/dL Final    Creatinine 32/95/1884 1.26  0.55 - 1.30 mg/dL Final    eGFRcr 16/60/6301 44 (L)  >=60 mL/min/1.50m*2 Final    Glucose 06/13/2023 90  70 - 110 mg/dL Final    Fasting? 60/02/9322 Unknown   Final    Calcium 06/13/2023 8.4 (L)  8.5 - 10.5 mg/dL Final    WBC 55/73/2202 7.7  4.0 - 11.0 K/uL Final    RBC 06/13/2023 3.45 (L)  3.70 - 5.20 M/uL Final    Hemoglobin 06/13/2023 10.8 (L)  11.0 - 16.0 g/dL Final    Hematocrit 54/27/0623 33.3  32.0 - 47.0 % Final    MCV 06/13/2023 96.5  80.0 - 100.0 fL Final    MCH 06/13/2023 31.3  26.0 - 34.0 pg Final    MCHC 06/13/2023 32.4  31.0 - 37.0 g/dL Final    RDW-SD  06/13/2023 45.1  35.0 - 51.0 fL Final    RDW-CV 06/13/2023 12.8  11.5 - 14.5 % Final    Platelets 06/13/2023 194  150 - 400 K/uL Final    MPV 06/13/2023 9.1  9.1 - 12.4 fL Final    NRBC % 06/13/2023 0.0  0.0 - 0.0 % Final    NRBC Absolute 06/13/2023 0.00  0.00 - 2.00 K/uL Final    Magnesium 06/13/2023 1.8  1.6 - 2.6 mg/dL Final    Phosphorus 16/03/9603 2.9  2.4 - 4.9 mg/dL Final    Sodium  54/01/8118 142  135 - 146 mmol/L Final    Potassium 06/14/2023 4.2  3.6 - 5.2 mmol/L Final    Chloride 06/14/2023 112 (H)  98 - 110 mmol/L Final    CO2 (Bicarbonate) 06/14/2023 24  20 - 32 mmol/L Final    Anion Gap 06/14/2023 6  3 - 14 mmol/L Final    BUN 06/14/2023 13  6 - 24 mg/dL Final    Creatinine 14/78/2956 1.24  0.55 - 1.30 mg/dL Final    eGFRcr 21/30/8657 45 (L)  >=60 mL/min/1.65m*2 Final    Glucose 06/14/2023 84  70 - 110 mg/dL Final    Fasting? 84/69/6295 Unknown   Final    Calcium 06/14/2023 9.0  8.5 - 10.5 mg/dL Final    Extra Tube 28/41/3244 Hold for add-ons.   Final    Auto resulted.        === 06/11/23 ===    XR HAND 3+ VIEWS LEFT    - Impression -  No acute osseous abnormality.      Darrow Bussing, MD 06/11/2023 3:47 PM        Your medication list        CHANGE how you take these medications        Instructions Last Dose Given Next Dose Due   calcium carbonate 500 mg calcium (1,250 mg) tablet  What changed: See the new instructions.      Take 1 tablet (1,250 mg) by mouth once daily.       docusate sodium 100 mg tablet  Commonly known as: Colace  What changed: See the new instructions.      Take 1 tablet (100 mg) by mouth once daily for 7 days.       Ensure High Protein liquid  Generic drug: nutritional drink  What changed: See the new instructions.      Take 7 Cans by mouth in the morning and 7 Cans at noon. Do all this for 7 days.       rosuvastatin 10 mg tablet  Commonly known as: Crestor  Start taking on: June 15, 2023  What changed:   medication strength  how much to take  Another medication with the same name was removed. Continue taking this medication, and follow the directions you see here.      Take 1 tablet (10 mg) by mouth once daily for 7 days.              CONTINUE taking these medications        Instructions Last Dose Given Next Dose Due   ferrous sulfate 160 (50 Fe) mg ER tablet      Take 1 tablet (160 mg) by mouth once daily for 7 days.       lisinopril 20 mg tablet      Take 1  tablet (20 mg) by mouth in the morning for 7 days.  melatonin 3 mg tablet,disintegrating      Take 1 tablet by mouth at bedtime for 7 days.       sertraline 25 mg tablet  Commonly known as: Zoloft      Take 25 mg by mouth once daily. Take with 50mg  tablet for a total of 75mg        sertraline 50 mg tablet  Commonly known as: Zoloft      Take 1 tablet by mouth once daily. Take with 25mg  tablet for a total of 75mg               STOP taking these medications      mirtazapine 15 mg tablet  Commonly known as: Remeron        Paxlovid 150-100 mg tablets,dose pack dose pack  Generic drug: nirmatrelvir-ritonavir (renal dosing)                  Where to Get Your Medications        These medications were sent to [facility] [place] [place] - [address]  [address] [place] [address]      Phone: [phone]   calcium carbonate 500 mg calcium (1,250 mg) tablet  docusate sodium 100 mg tablet  Ensure High Protein liquid  ferrous sulfate 160 (50 Fe) mg ER tablet  lisinopril 20 mg tablet  melatonin 3 mg tablet,disintegrating  rosuvastatin 10 mg tablet                  Pending labs and tests:  Results from last 7 days   Lab Units 06/14/23  0608 06/13/23  0628 06/12/23  0402 06/11/23  1402   WBC AUTO K/uL  --  7.7 11.1* 19.6*   HEMOGLOBIN g/dL  --  62.1* 30.8 65.7   HEMATOCRIT %  --  33.3 35.9 40.5   PLATELETS AUTO K/uL  --  194 221 283   SODIUM mmol/L 142 142 141 140   POTASSIUM mmol/L 4.2 3.5* 4.1 4.5   CHLORIDE mmol/L 112* 113* 112* 110   CO2 mmol/L 24 24 26 26    BUN mg/dL 13 17 25* 28*   CREATININE mg/dL 8.46 9.62 9.52* 8.41*   ANION GAP mmol/L 6 5 3 4    CALCIUM mg/dL 9.0 8.4* 8.9 9.4   MAGNESIUM mg/dL  --  1.8  --  2.0   PHOSPHORUS mg/dL  --  2.9  --   --    ALT U/L  --   --   --  27   AST U/L  --   --   --  22   ALK PHOS U/L  --   --   --  131*   BILIRUBIN TOTAL mg/dL  --   --   --  0.4       __________________________________________________________    Discharge physical exam    Physical Exam:  Vital  Signs:  Visit Vitals  BP (!) 167/80 (BP Location: Left arm, Patient Position: Lying) Comment: RN Notified   Pulse 69   Temp 36.8 C (98.3 F) (Oral)   Resp 18   Ht 1.676 m   Wt 74.8 kg   SpO2 96%   BMI 26.63 kg/m   OB Status Hysterectomy   BSA 1.87 m        General: Patient looks comfortable  Confused  HENT: Oral mucosa is moist.  Neck: Supple, Non-tender  Respiratory: Diminished  Cardiovascular: Normal rate, Regular rhythm, s1 s2 appreciated  Gastrointestinal:  Soft, Non-tender, Non-distended.  Musculoskeletal: moves extremities   integumentary: Warm  __________________________________________________________  F/U with PCP in 7-10 days after discharge        Family was counseled and agrees to this discharge plan.      Signed:  Hanley Seamen.  June 14, 2023  12:26 PM    FACE TO FACE PATIENT COUNSELING COORDINATING CARE MORE THAN 50% OF ENCOUNTER TIME : YES   TOTAL ENCOUNTER TIME: DSCHG>30" minutes.      Discharge Diagnosis  Alcohol withdrawal delirium (Multi-HCC)

## 2023-06-14 NOTE — Nursing Note (Signed)
Called daughter [name] to inform her that the pt is leaving at 7pm to [facility], [name] did not answer the phone but I message was left informing her.

## 2023-06-14 NOTE — Progress Notes (Signed)
[facility] REFERRAL FORM  Demographics       Address   [facility] [address] [place] [address]    Phone Numbers   Hm: [phone] Cell: [phone]    Marital Status   Widowed    Insurance Information   [place] [employer]    Religion   None             Allergies (Reviewed on: 06/12/23)    No Known Allergies       Health Care Agents       Nicole [facility] Agent - Daughter Not Active   Primary Phone: [phone] [facility])  Home Phone: [phone]                 Preferred Language       Preferred Language  Portuguese Preferred Written Language  English          Advanced Directive Assessment      Flowsheet Row Most Recent Value   Advance Directive Patient has advance directive, copy in chart Filed at 06/12/2023 1148             Medication List        CHANGE how you take these medications      calcium carbonate 500 mg calcium (1,250 mg) tablet  Take 1 tablet (1,250 mg) by mouth once daily.  What changed: See the new instructions.     docusate sodium 100 mg tablet  Commonly known as: Colace  Take 1 tablet (100 mg) by mouth once daily for 7 days.  What changed: See the new instructions.     Ensure High Protein liquid  Take 7 Cans by mouth in the morning and 7 Cans at noon. Do all this for 7 days.  Generic drug: nutritional drink  What changed: See the new instructions.     rosuvastatin 10 mg tablet  Commonly known as: Crestor  Start taking on: June 15, 2023  Take 1 tablet (10 mg) by mouth once daily for 7 days.  Last time this was given: June 12, 2023  8:54 AM  What changed:   medication strength  how much to take  Another medication with the same name was removed. Continue taking this medication, and follow the directions you see here.            CONTINUE taking these medications      ferrous sulfate 160 (50 Fe) mg ER tablet  Take 1 tablet (160 mg) by mouth once daily for 7 days.     lisinopril 20 mg tablet  Take 1 tablet (20 mg) by mouth in the morning for  7 days.     melatonin 3 mg tablet,disintegrating  Take 1 tablet by mouth at bedtime for 7 days.     * sertraline 25 mg tablet  Commonly known as: Zoloft  Take 25 mg by mouth once daily. Take with 50mg  tablet for a total of 75mg      * sertraline 50 mg tablet  Commonly known as: Zoloft  Take 1 tablet by mouth once daily. Take with 25mg  tablet for a total of 75mg            * This list has 2 medication(s) that are the same as other medications prescribed for you. Read the directions carefully, and ask your doctor or other care provider to review them with you.  STOP taking these medications      mirtazapine 15 mg tablet  Commonly known as: Remeron     Paxlovid 150-100 mg tablets,dose pack dose pack  Generic drug: nirmatrelvir-ritonavir (renal dosing)                    Discharge Referral Orders (From admission, onward)      None          Nursing Post-Acute/Referral Information (Page 2)      Flowsheet Row Most Recent Value   Self Care Activities Functional Level    Dressing Dependent   Grooming Dependent   Feeding Needs assistance   Bathing Dependent   Toileting Dependent   Bowel and Bladder    Last BM Date 06/13/23   Height and Weight    Height 1.676 m   Height Method Stated   Weight 74.8 kg   Weight Method Stated          Patient Lines/Drains/Airways Status       Active Lines, Drains, Airways          Peripheral IV 06/11/23 Anterior;Left;Upper Arm    Placement date 06/11/23  06/11/23 2140 Site Arm  06/11/23 2140    Placement time 2140  06/11/23 2140 Days 2    Hand Hygiene Completed: Yes  06/11/23 2140 Size (Gauge): 20 G  06/11/23 2140    Orientation: Anterior;Left;Upper  06/11/23 2140 Site Prep: Chlorhexidine  06/11/23 2140    Technique: Ultrasound guidance  06/11/23 2140 Placed by: SG  06/11/23 2140    Insertion attempts: 1  06/11/23 2140 Patient Tolerance: Tolerated well  06/11/23 2140    Difficult Venous Access: Yes  06/11/23 2140        Assessments       Row Name 06/13/23 2025    Site Assessment  Clean;Dry;Intact  06/14/23 0134    Dressing Type Transparent  06/14/23 0134    Lumen Status Infusing  06/14/23 0134    Dressing Status Clean/Dry/Intact  06/14/23 0134                          After Discharge Information       Skilled Nursing Facility: [facility]    Skilled Nursing Facility Address: [address]  [place], [place] [address]    Skilled Nursing Facility Phone #: [phone]          General Therapy Patient Information (Page 3)      Flowsheet Row Most Recent Value   Precautions    Other Precautions Fall Risk, Delirium Risk Filed at 06/14/2023 1103   Home Living    Was Patient Admitted from STR? No Filed at 06/14/2023 1103   Lives With Alone Filed at 06/14/2023 1103   Type of Home Assisted Living  [memory care unit] Filed at 06/14/2023 1103   Home Layout One Level Filed at 06/14/2023 1103   Number of Stairs to Enter Home 0 Filed at 06/14/2023 1103   Number of Stairs Within Home 0 Filed at 06/14/2023 1103   Home Equipment Rolling Walker Filed at 06/14/2023 1103   ADL/IADL Equipment Shower Chair, Grab Bars Around Pensions consultant, Engineer, materials in [place] Filed at 06/14/2023 1103   Comments per EMR, pt usually ambulatory with RW Filed at 06/14/2023 1103   Prior Level of Function    Information Provided By EMR Filed at 06/14/2023 1103   Independent at Baseline All Household Mobility, Ambulation, With Device Filed at 06/14/2023 1103  Needs Assistance with at Baseline All ADL's, All IADL's Filed at 06/14/2023 6433   History of Recent Falls Yes Filed at 06/14/2023 1103   Cognition    Overall Cognitive Status Impaired Filed at 06/14/2023 1103   Orientation Level Unable to assess Filed at 06/14/2023 1103   Following Commands Difficulty following one step commands Filed at 06/14/2023 1103   Safety Judgment Decreased Safety awareness Filed at 06/14/2023 1103   Awareness of Errors/Deficits Impaired Filed at 06/14/2023 1103   Attention Span Impaired Filed at 06/14/2023 1103   Memory Impaired Filed at 06/14/2023 1103   Problem Solving  Impaired Filed at 06/14/2023 1103   Behavioral/Affect Comments pt appears confused, difficulty following direction despite use of interpreter, demo, VC/TC. often looking around, poor attention to task or safety awareness Filed at 06/14/2023 1103   Cognition Treatment Details pt primarily prefer to be sidelying in fetal position in bed. attempted to transfer pt OOB to recliner where she resumed same position therefore for safety returned to bed. Filed at 06/14/2023 1103          Combined Therapy Assessment (Page 3)      Flowsheet Row Most Recent Value   Bed Mobility    Bed Position HOB flat, Use of bedrail Filed at 06/14/2023 1103   Roll Left Moderate Assist Filed at 06/14/2023 1103   Roll Right Moderate Assist Filed at 06/14/2023 1103   Supine to Sit Moderate Assist Filed at 06/14/2023 1103   Sit to Supine Moderate Assist Filed at 06/14/2023 1103   Scooting Minimal Assist Filed at 06/14/2023 1103   Bed Mobility Comments required max VC/TC to complete bed mobility. rolled L and R for linen and brief changed. assist at BLE and trunk to sit up EOB. pt often attempting to lay back down in bed Filed at 06/14/2023 1103   Transfers    Sit to Stand Moderate Assist, X2 Assists Filed at 06/14/2023 1103   Stand to Sit Moderate Assist, X2 Assists Filed at 06/14/2023 1103   Bed to Chair Moderate Assist, X2 Assists Filed at 06/14/2023 1103   Chair to Bed Moderate Assist, X2 Assists Filed at 06/14/2023 1103   Transfers Comments pt stood with max cues and assist to RW. once in standing, able to take a few steps forwards with CGA however decreased safety awareness/STM, moving RW away and reaching for stuff on bed rail. requried max VC/TC to redirect pt to sit in chair. poor safety awareness and eccentric control when returning to sitting EOB or in recliner chair, pivoting hips and requiring mod A for safety. once in recliner chair, pt immediately returned to fetal position in chair. for pt comfort and safety opted to return to bed  instead due to risk of pt sliding out of chair in side lying. Filed at 06/14/2023 1103          Speech Therapy - Swallow (Page 3)      Flowsheet Row Most Recent Value   Recommendations    Recommended Solid Consistency CONTINUE ON, Regular Consistency Filed at 06/13/2023 1200   Recommended Liquid Consistency CONTINUE ON, L0: Thin Liquids Filed at 06/13/2023 1200   Administer Liquids Via Per patient preference Filed at 06/13/2023 1200   Administer Medications (with MD/PharmD Approval) Regular Pills as Tolerated Filed at 06/13/2023 1200   Compensatory Techniques Ensure Swallow Before Next Bite, Alternate Solids & Liquids, Small Bites Filed at 06/13/2023 1200   Positional Techniques Upright 90 Degrees for All Meals Filed at 06/13/2023 1200  Supervision Close Supervision in Room Filed at 06/13/2023 1200   MD May Wish to Consider Consults to: Defer to PCP Filed at 06/13/2023 1200   Mouthcare Mouthcare at a minimum of 3x Daily with a toothbrush & Toothpaste if Possible Filed at 06/13/2023 1200   Modified Barium Swallow Recommendations Does not appear to be clinically indicated at this time Filed at 06/13/2023 1200          Other Instructions       DNR/DNI                 Discharge Summary        Discharge Summary by Rod Holler, NP at 06/14/2023 12:26 PM  Version 2 of 2      Author: Rod Holler, NP Service: Internal Medicine Author Type: Nurse Practitioner    Filed: 06/14/2023  2:49 PM Date of Service: 06/14/2023 12:26 PM Status: Cosign Needed    Editor: Rod Holler, NP (Nurse Practitioner)    Related Notes: Original Note by Rod Holler, NP (Nurse Practitioner) filed at 06/14/2023  1:19 PM    Cosign Required: Yes         [facility]  [address] - [address] [place] [address]    DISCHARGE SUMMARY      Date of admission:  06/11/2023   Date of discharge:   06/14/2023        PCP:  Richardean Sale, MD    Principal diagnosis:Dehydration    Other actively managed  diagnoses:  Hyperlipidemia  Hypertension  Dementia    Discharge condition: Stable    Disposition: Rehab    Issues to be addressed post-discharge:  Hyperlipidemia  Hypertension  Dementia    Code status: DNR/DNI    ________________________________________________________    Hospital Course:     78 year old female with past medical history of     Hyperlipidemia  Hypertension  Dementia     Presented to the emergency department complaining of abdominal pain and diarrhea.  She also recently tested positive for COVID-19 infection on December 30.  There was a concern for dehydration in the ED for which she received 2 L of LR bolus, stool studies were ordered and medicine was paged for admission.     Per ED signout, daughter noticed patient to be "off" for the past few days. She also reported some hand pain on the left side.      Case discussed with ED provider.     Patient is very confused I believe from her dementia.  In spite of arranging for an interpreter, she was not really able to participate in the conversation.  She was awake though.    ASSESSMENT & PLAN:        COVID-19 infection  Patient's respiratory status is stable  She was prescribed Paxlovid on December 30  1/14-Resolved     Diarrhea and abdominal pain  Could be in the setting of COVID-19 infection  Continue fluid resuscitation  Repeat lactate level improving  C. difficile, stool pathogen and fecal leukocytes ordered and pending.  1/14-Stool Cultures -Negative     Speech pathology consult for swallow evaluation     AKI on CKD  Could be in the setting of dehydration  Continue fluid resuscitation and recheck BMP in the morning.  Improving  1/14-BUN-13/CR-1.24     Hypokalemia repleted  1/14-K-4.2     Syncope  Patient presented to the ER on December 30 after an unwitnessed fall however was sent back to the ED  as per her daughter's request that time.   orthostatic blood pressure.  Ordered.  Pending  1/14-167/80     Hypertension  Hold off on lisinopril and  hydrochlorothiazide given AKI and dehydration  1/14-167/80     Hyperlipidemia  1/14-Rosuvastatin 40 mg daily     Left hand pain as reported to me by ER  Xray did not reveal anything acute  1/14-Negative     Microscopic proteinuria  Elevated alkaline phosphatase  Monocytosis  1/14-Outpatient follow-up     Fat tissue density around right femoral vein.  CT scan could not differentiate between hernia or nonencapsulated lipoma.  This can be followed up as an outpatient with an MRI.  No incarceration as reported to ED physician by radiologist.  1/14-OP F/U     DVT prophylaxis: Heparin  1/14-Encourage ambulation   PT Recommended Rehab    GI prophylaxis: Not indicated        DNR/DNI     Chart reviewed. Images reviewed. Home and hospital medications reviewed.       Allergies  No Known Allergies    Procedures:  XR HAND RIGHT 1-2 VIEWS   Final Result      1. No acute fracture on radiograph.   2. Multilevel degenerative changes.         Delice Bison, MD 06/11/2023 3:21 PM      XR HAND LEFT 3+ VIEWS   Final Result   No acute osseous abnormality.         Darrow Bussing, MD 06/11/2023 3:47 PM      CT ABDOMEN PELVIS W CONTRAST   Final Result   Motion limited exam. No definite acute process identified in the abdomen or pelvis.       Partial effacement of the right femoral vein. There is adjacent fat density tissue which does not appear clearly defined. The etiology of this is uncertain as this cannot be clearly qualify as a femoral hernia or other pathology such as nonencapsulated lipoma. Surgical consultation is suggested for clinical evaluation for hernia. MRI could also be performed for further evaluation on a nonemergent basis.      Darrow Bussing, MD 06/11/2023 3:39 PM            Consultants:        C. PHARYNGEAL PHASE  Pt without overt s/sx pharyngeal struggle across all consistencies. Swallow trigger appeared prompt. Hyolaryngeal elevation and excursion present but suspected to be reduced in ROM. No congestion or SOB noted.  Vocal quality remained clear.          D.  OVERALL SUMMARY & IMPRESSIONS  [place] presents with grossly functional oral and pharyngeal phases of the swallow at bedside. Oral phase efficient for bolus manipulation and propulsion. Pt without s/sx of pharyngeal struggle. SLP recommends pt continue on Regular solids and L0 Thin liquids. Pt recommended close supervision as needed with PO due to confusion. SLP suspects as pt becomes more confused her swallow function is effected. Please only allow PO when pt is awake and willing to accept. SLP services not indicated at this time.       IV.  RECOMMENDATIONS:  1. Recommended Solid Consistency: CONTINUE ON, Regular Consistency   2. Recommended Liquid Consistency: CONTINUE ON, L0: Thin Liquids   3. Administer Liquids Via: Per patient preference   4. Administer Medications (with MD/PharmD Approval): Regular Pills as Tolerated   5. Compensatory Techniques: Ensure Swallow Before Next Bite, Alternate Solids & Liquids, Small Bites   6. Positional Techniques:  Upright 90 Degrees for All Meals   7. Supervision: Close Supervision in Room   8. MD May Wish to Consider Consults to:: Defer to PCP   9. Mouthcare: Mouthcare at a minimum of 3x Daily with a toothbrush & Toothpaste if Possible   10 Modified Barium Swallow Recommendations: Does not appear to be clinically indicated at this time      Education  Results, Recommendations, & Plan Discussed with: Patient  Education Provided Regarding: Plan of Care, Thin Liquids Recommendations, Food Consistency Recommendations  Method of Education: Discussion      V.  PLAN OF CARE  Is Skilled SLP Therapy Recommended: No  Frequency of Treatment: Discharge from skilled services  Duration of Treatment: Discharge from skilled services         Pertinent lab findings and imaging:  Admission on 06/11/2023   Component Date Value Ref Range Status    Sodium 06/11/2023 140  135 - 146 mmol/L Final    Potassium 06/11/2023 4.5  3.6 - 5.2 mmol/L Final     Chloride 06/11/2023 110  98 - 110 mmol/L Final    CO2 (Bicarbonate) 06/11/2023 26  20 - 32 mmol/L Final    Anion Gap 06/11/2023 4  3 - 14 mmol/L Final    BUN 06/11/2023 28 (H)  6 - 24 mg/dL Final    Creatinine 57/84/6962 1.68 (H)  0.55 - 1.30 mg/dL Final    eGFRcr 95/28/4132 31 (L)  >=60 mL/min/1.32m*2 Final    Glucose 06/11/2023 139 (H)  70 - 110 mg/dL Final    Fasting? 44/05/270 Unknown   Final    Calcium 06/11/2023 9.4  8.5 - 10.5 mg/dL Final    AST 53/66/4403 22  6 - 42 U/L Final    ALT 06/11/2023 27  0 - 55 U/L Final    Alkaline phosphatase 06/11/2023 131 (H)  30 - 130 U/L Final    Protein, total 06/11/2023 7.6  6.0 - 8.4 g/dL Final    Albumin 47/42/5956 4.0  3.2 - 5.0 g/dL Final    Bilirubin, total 06/11/2023 0.4  0.2 - 1.2 mg/dL Final    WBC 38/75/6433 19.6 (H)  4.0 - 11.0 K/uL Final    RBC 06/11/2023 4.31  3.70 - 5.20 M/uL Final    Hemoglobin 06/11/2023 13.6  11.0 - 16.0 g/dL Final    Hematocrit 29/51/8841 40.5  32.0 - 47.0 % Final    MCV 06/11/2023 94.0  80.0 - 100.0 fL Final    MCH 06/11/2023 31.6  26.0 - 34.0 pg Final    MCHC 06/11/2023 33.6  31.0 - 37.0 g/dL Final    RDW-CV 66/10/3014 12.5  11.5 - 14.5 % Final    RDW-SD 06/11/2023 43.8  35.0 - 51.0 fL Final    Platelets 06/11/2023 283  150 - 400 K/uL Final    MPV 06/11/2023 8.7 (L)  9.1 - 12.4 fL Final    NRBC % 06/11/2023 0.0  0.0 - 0.0 % Final    NRBC Absolute 06/11/2023 0.00  0.00 - 2.00 K/uL Final    Color, Ur 06/11/2023 Yellow  Yellow, Dark Yellow Final    Clarity, Ur 06/11/2023 Clear  Clear Final    Specific Gravity, Ur 06/11/2023 >=1.030  1.005 - 1.030 Final    pH, Ur 06/11/2023 6.0  5.0 - 8.0 Final    Protein,Ur 06/11/2023 30 (A)  Negative mg/dL Final    Glucose,Ur 06/08/3233 Negative  Negative mg/dL Final    Ketones, Ur 57/32/2025 Negative  Negative mg/dL  Final    Bilirubin, Ur 06/11/2023 Negative  Negative Final    Blood, Ur 06/11/2023 Negative  Negative Final    Urobilinogen, Ur 06/11/2023 0.2  0.2-1.0 E.U./dl E.U./dl Final    Nitrite, Ur  86/57/8469 Negative  Negative Final    Leukocyte Esterase, Ur 06/11/2023 Negative  Negative WBC/uL Final    RBC, Ur 06/11/2023 1  0-4 cells/HPF cells/HPF Final    WBC, Ur 06/11/2023 1  0-5 cells/HPF cells/HPF Final    Epithelial Cells, UR 06/11/2023 3  0-5 cells/HPF cells/HPF Final    Bacteria, Ur 06/11/2023 None Seen  None Seen Final    Casts, Ur 06/11/2023 0  0-4 cells/LPF cells/LPF Final    Lipase 06/11/2023 60  13 - 75 U/L Final    Magnesium 06/11/2023 2.0  1.6 - 2.6 mg/dL Final    Neutrophils % 06/11/2023 90  % Final    Lymphocytes % 06/11/2023 4  % Final    Monocytes % 06/11/2023 6  % Final    Neutrophils Absolute 06/11/2023 17.64 (H)  1.50 - 7.95 K/uL Final    Lymphocytes Absolute 06/11/2023 0.78  0.70 - 4.00 K/uL Final    Monocytes Absolute 06/11/2023 1.18 (H)  0.36 - 0.77 K/uL Final    RBC Morphology 06/11/2023 Normal  Normal, Not Performed Final    PLT Morphology 06/11/2023 Normal  Normal, Not Performed Final    Total WBC Counted 06/11/2023 100   Final    Lactic acid 06/11/2023 3.8 (H)  0.4 - 2.0 mmol/L Final    Lactic acid 06/11/2023 3.7 (H)  0.4 - 2.0 mmol/L Final    Lactic acid 06/12/2023 1.6  0.4 - 2.0 mmol/L Final    Sodium 06/12/2023 141  135 - 146 mmol/L Final    Potassium 06/12/2023 4.1  3.6 - 5.2 mmol/L Final    Chloride 06/12/2023 112 (H)  98 - 110 mmol/L Final    CO2 (Bicarbonate) 06/12/2023 26  20 - 32 mmol/L Final    Anion Gap 06/12/2023 3  3 - 14 mmol/L Final    BUN 06/12/2023 25 (H)  6 - 24 mg/dL Final    Creatinine 62/95/2841 1.32 (H)  0.55 - 1.30 mg/dL Final    eGFRcr 32/44/0102 42 (L)  >=60 mL/min/1.43m*2 Final    Glucose 06/12/2023 99  70 - 110 mg/dL Final    Fasting? 72/53/6644 Unknown   Final    Calcium 06/12/2023 8.9  8.5 - 10.5 mg/dL Final    WBC 03/47/4259 11.1 (H)  4.0 - 11.0 K/uL Final    RBC 06/12/2023 3.78  3.70 - 5.20 M/uL Final    Hemoglobin 06/12/2023 11.7  11.0 - 16.0 g/dL Final    Hematocrit 56/38/7564 35.9  32.0 - 47.0 % Final    MCV 06/12/2023 95.0  80.0 - 100.0 fL Final     MCH 06/12/2023 31.0  26.0 - 34.0 pg Final    MCHC 06/12/2023 32.6  31.0 - 37.0 g/dL Final    RDW-SD 33/29/5188 44.1  35.0 - 51.0 fL Final    RDW-CV 06/12/2023 12.9  11.5 - 14.5 % Final    Platelets 06/12/2023 221  150 - 400 K/uL Final    MPV 06/12/2023 8.7 (L)  9.1 - 12.4 fL Final    NRBC % 06/12/2023 0.0  0.0 - 0.0 % Final    NRBC Absolute 06/12/2023 0.00  0.00 - 2.00 K/uL Final    Sodium 06/13/2023 142  135 - 146 mmol/L Final    Potassium 06/13/2023 3.5 (  L)  3.6 - 5.2 mmol/L Final    Chloride 06/13/2023 113 (H)  98 - 110 mmol/L Final    CO2 (Bicarbonate) 06/13/2023 24  20 - 32 mmol/L Final    Anion Gap 06/13/2023 5  3 - 14 mmol/L Final    BUN 06/13/2023 17  6 - 24 mg/dL Final    Creatinine 16/03/9603 1.26  0.55 - 1.30 mg/dL Final    eGFRcr 54/01/8118 44 (L)  >=60 mL/min/1.75m*2 Final    Glucose 06/13/2023 90  70 - 110 mg/dL Final    Fasting? 14/78/2956 Unknown   Final    Calcium 06/13/2023 8.4 (L)  8.5 - 10.5 mg/dL Final    WBC 21/30/8657 7.7  4.0 - 11.0 K/uL Final    RBC 06/13/2023 3.45 (L)  3.70 - 5.20 M/uL Final    Hemoglobin 06/13/2023 10.8 (L)  11.0 - 16.0 g/dL Final    Hematocrit 84/69/6295 33.3  32.0 - 47.0 % Final    MCV 06/13/2023 96.5  80.0 - 100.0 fL Final    MCH 06/13/2023 31.3  26.0 - 34.0 pg Final    MCHC 06/13/2023 32.4  31.0 - 37.0 g/dL Final    RDW-SD 28/41/3244 45.1  35.0 - 51.0 fL Final    RDW-CV 06/13/2023 12.8  11.5 - 14.5 % Final    Platelets 06/13/2023 194  150 - 400 K/uL Final    MPV 06/13/2023 9.1  9.1 - 12.4 fL Final    NRBC % 06/13/2023 0.0  0.0 - 0.0 % Final    NRBC Absolute 06/13/2023 0.00  0.00 - 2.00 K/uL Final    Magnesium 06/13/2023 1.8  1.6 - 2.6 mg/dL Final    Phosphorus 06/02/7251 2.9  2.4 - 4.9 mg/dL Final    Sodium 66/44/0347 142  135 - 146 mmol/L Final    Potassium 06/14/2023 4.2  3.6 - 5.2 mmol/L Final    Chloride 06/14/2023 112 (H)  98 - 110 mmol/L Final    CO2 (Bicarbonate) 06/14/2023 24  20 - 32 mmol/L Final    Anion Gap 06/14/2023 6  3 - 14 mmol/L Final    BUN  06/14/2023 13  6 - 24 mg/dL Final    Creatinine 42/59/5638 1.24  0.55 - 1.30 mg/dL Final    eGFRcr 75/64/3329 45 (L)  >=60 mL/min/1.86m*2 Final    Glucose 06/14/2023 84  70 - 110 mg/dL Final    Fasting? 51/88/4166 Unknown   Final    Calcium 06/14/2023 9.0  8.5 - 10.5 mg/dL Final    Extra Tube 11/28/1599 Hold for add-ons.   Final    Auto resulted.        === 06/11/23 ===    XR HAND 3+ VIEWS LEFT    - Impression -  No acute osseous abnormality.      Darrow Bussing, MD 06/11/2023 3:47 PM        Your medication list        CHANGE how you take these medications        Instructions Last Dose Given Next Dose Due   calcium carbonate 500 mg calcium (1,250 mg) tablet  What changed: See the new instructions.      Take 1 tablet (1,250 mg) by mouth once daily.       docusate sodium 100 mg tablet  Commonly known as: Colace  What changed: See the new instructions.      Take 1 tablet (100 mg) by mouth once daily for 7 days.  Ensure High Protein liquid  Generic drug: nutritional drink  What changed: See the new instructions.      Take 7 Cans by mouth in the morning and 7 Cans at noon. Do all this for 7 days.       rosuvastatin 10 mg tablet  Commonly known as: Crestor  Start taking on: June 15, 2023  What changed:   medication strength  how much to take  Another medication with the same name was removed. Continue taking this medication, and follow the directions you see here.      Take 1 tablet (10 mg) by mouth once daily for 7 days.              CONTINUE taking these medications        Instructions Last Dose Given Next Dose Due   ferrous sulfate 160 (50 Fe) mg ER tablet      Take 1 tablet (160 mg) by mouth once daily for 7 days.       lisinopril 20 mg tablet      Take 1 tablet (20 mg) by mouth in the morning for 7 days.       melatonin 3 mg tablet,disintegrating      Take 1 tablet by mouth at bedtime for 7 days.       sertraline 25 mg tablet  Commonly known as: Zoloft      Take 25 mg by mouth once daily. Take with 50mg   tablet for a total of 75mg        sertraline 50 mg tablet  Commonly known as: Zoloft      Take 1 tablet by mouth once daily. Take with 25mg  tablet for a total of 75mg               STOP taking these medications      mirtazapine 15 mg tablet  Commonly known as: Remeron        Paxlovid 150-100 mg tablets,dose pack dose pack  Generic drug: nirmatrelvir-ritonavir (renal dosing)                  Where to Get Your Medications        These medications were sent to [facility] [place], [place] - [address]  [address] [address], [place] [address]      Phone: [phone]   calcium carbonate 500 mg calcium (1,250 mg) tablet  docusate sodium 100 mg tablet  Ensure High Protein liquid  ferrous sulfate 160 (50 Fe) mg ER tablet  lisinopril 20 mg tablet  melatonin 3 mg tablet,disintegrating  rosuvastatin 10 mg tablet                  Pending labs and tests:  Results from last 7 days   Lab Units 06/14/23  0608 06/13/23  0628 06/12/23  0402 06/11/23  1402   WBC AUTO K/uL  --  7.7 11.1* 19.6*   HEMOGLOBIN g/dL  --  91.4* 78.2 95.6   HEMATOCRIT %  --  33.3 35.9 40.5   PLATELETS AUTO K/uL  --  194 221 283   SODIUM mmol/L 142 142 141 140   POTASSIUM mmol/L 4.2 3.5* 4.1 4.5   CHLORIDE mmol/L 112* 113* 112* 110   CO2 mmol/L 24 24 26 26    BUN mg/dL 13 17 25* 28*   CREATININE mg/dL 2.13 0.86 5.78* 4.69*   ANION GAP mmol/L 6 5 3 4    CALCIUM mg/dL 9.0 8.4* 8.9 9.4  MAGNESIUM mg/dL  --  1.8  --  2.0   PHOSPHORUS mg/dL  --  2.9  --   --    ALT U/L  --   --   --  27   AST U/L  --   --   --  22   ALK PHOS U/L  --   --   --  131*   BILIRUBIN TOTAL mg/dL  --   --   --  0.4       __________________________________________________________    Discharge physical exam    Physical Exam:  Vital Signs:  Visit Vitals  BP (!) 167/80 (BP Location: Left arm, Patient Position: Lying) Comment: RN Notified   Pulse 69   Temp 36.8 C (98.3 F) (Oral)   Resp 18   Ht 1.676 m   Wt 74.8 kg   SpO2 96%   BMI 26.63 kg/m   OB Status Hysterectomy   BSA 1.87 m         General: Patient looks comfortable  Confused  HENT: Oral mucosa is moist.  Neck: Supple, Non-tender  Respiratory: Diminished  Cardiovascular: Normal rate, Regular rhythm, s1 s2 appreciated  Gastrointestinal: Soft, Non-tender, Non-distended.  Musculoskeletal: moves extremities   integumentary: Warm  __________________________________________________________  F/U with PCP in 7-10 days after discharge        Family was counseled and agrees to this discharge plan.      Signed:  Hanley Seamen.  June 14, 2023  12:26 PM    FACE TO FACE PATIENT COUNSELING COORDINATING CARE MORE THAN 50% OF ENCOUNTER TIME : YES   TOTAL ENCOUNTER TIME: DSCHG>30" minutes.      Discharge Diagnosis  Alcohol withdrawal delirium (Multi-HCC)                                          Discharge Summary by Rod Holler, NP at 06/14/2023 12:26 PM  Version 1 of 2      Author: Rod Holler, NP Service: Internal Medicine Author Type: Nurse Practitioner    Filed: 06/14/2023  1:19 PM Date of Service: 06/14/2023 12:26 PM Status: Cosign Needed    Editor: Rod Holler, NP (Nurse Practitioner)    Related Notes: Addendum by Rod Holler, NP (Nurse Practitioner) filed at 06/14/2023  2:49 PM    Cosign Required: Yes         [facility]  [address] - [address], [place] [address]    DISCHARGE SUMMARY      Date of admission:  06/11/2023   Date of discharge:   06/14/2023        PCP:  Richardean Sale, MD    Principal diagnosis:Dehydration    Other actively managed diagnoses:  Hyperlipidemia  Hypertension  Dementia    Discharge condition: Stable    Disposition: Rehab    Issues to be addressed post-discharge:  Hyperlipidemia  Hypertension  Dementia    Code status: DNR/DNI    ________________________________________________________    Hospital Course:     78 year old female with past medical history of     Hyperlipidemia  Hypertension  Dementia     Presented to the emergency department complaining of abdominal pain and diarrhea.  She also  recently tested positive for COVID-19 infection on December 30.  There was a concern for dehydration in the ED for which she received 2 L of LR bolus, stool studies were  ordered and medicine was paged for admission.     Per ED signout, daughter noticed patient to be "off" for the past few days. She also reported some hand pain on the left side.      Case discussed with ED provider.     Patient is very confused I believe from her dementia.  In spite of arranging for an interpreter, she was not really able to participate in the conversation.  She was awake though.    ASSESSMENT & PLAN:        COVID-19 infection  Patient's respiratory status is stable  She was prescribed Paxlovid on December 30  1/14-Resolved     Diarrhea and abdominal pain  Could be in the setting of COVID-19 infection  Continue fluid resuscitation  Repeat lactate level improving  C. difficile, stool pathogen and fecal leukocytes ordered and pending.  1/14-Stool Cultures -Negative     Speech pathology consult for swallow evaluation     AKI on CKD  Could be in the setting of dehydration  Continue fluid resuscitation and recheck BMP in the morning.  Improving  1/14-BUN-13/CR-1.24     Hypokalemia repleted  1/14-K-4.2     Syncope  Patient presented to the ER on December 30 after an unwitnessed fall however was sent back to the ED as per her daughter's request that time.   orthostatic blood pressure.  Ordered.  Pending  1/14-167/80     Hypertension  Hold off on lisinopril and hydrochlorothiazide given AKI and dehydration  1/14-167/80     Hyperlipidemia  1/14-Rosuvastatin 40 mg daily     Left hand pain as reported to me by ER  Xray did not reveal anything acute  1/14-Negative     Microscopic proteinuria  Elevated alkaline phosphatase  Monocytosis  1/14-Outpatient follow-up     Fat tissue density around right femoral vein.  CT scan could not differentiate between hernia or nonencapsulated lipoma.  This can be followed up as an outpatient with an MRI.  No  incarceration as reported to ED physician by radiologist.  1/14-OP F/U     DVT prophylaxis: Heparin  1/14-Encourage ambulation   PT Recommended Rehab    GI prophylaxis: Not indicated        DNR/DNI     Chart reviewed. Images reviewed. Home and hospital medications reviewed.       Allergies  No Known Allergies    Procedures:  XR HAND RIGHT 1-2 VIEWS   Final Result      1. No acute fracture on radiograph.   2. Multilevel degenerative changes.         Delice Bison, MD 06/11/2023 3:21 PM      XR HAND LEFT 3+ VIEWS   Final Result   No acute osseous abnormality.         Darrow Bussing, MD 06/11/2023 3:47 PM      CT ABDOMEN PELVIS W CONTRAST   Final Result   Motion limited exam. No definite acute process identified in the abdomen or pelvis.       Partial effacement of the right femoral vein. There is adjacent fat density tissue which does not appear clearly defined. The etiology of this is uncertain as this cannot be clearly qualify as a femoral hernia or other pathology such as nonencapsulated lipoma. Surgical consultation is suggested for clinical evaluation for hernia. MRI could also be performed for further evaluation on a nonemergent basis.      Darrow Bussing, MD 06/11/2023 3:39 PM  Consultants:        C. PHARYNGEAL PHASE  Pt without overt s/sx pharyngeal struggle across all consistencies. Swallow trigger appeared prompt. Hyolaryngeal elevation and excursion present but suspected to be reduced in ROM. No congestion or SOB noted. Vocal quality remained clear.          D.  OVERALL SUMMARY & IMPRESSIONS  Kim Blake presents with grossly functional oral and pharyngeal phases of the swallow at bedside. Oral phase efficient for bolus manipulation and propulsion. Pt without s/sx of pharyngeal struggle. SLP recommends pt continue on Regular solids and L0 Thin liquids. Pt recommended close supervision as needed with PO due to confusion. SLP suspects as pt becomes more confused her swallow function is effected.  Please only allow PO when pt is awake and willing to accept. SLP services not indicated at this time.       IV.  RECOMMENDATIONS:  1. Recommended Solid Consistency: CONTINUE ON, Regular Consistency   2. Recommended Liquid Consistency: CONTINUE ON, L0: Thin Liquids   3. Administer Liquids Via: Per patient preference   4. Administer Medications (with MD/PharmD Approval): Regular Pills as Tolerated   5. Compensatory Techniques: Ensure Swallow Before Next Bite, Alternate Solids & Liquids, Small Bites   6. Positional Techniques: Upright 90 Degrees for All Meals   7. Supervision: Close Supervision in Room   8. MD May Wish to Consider Consults to:: Defer to PCP   9. Mouthcare: Mouthcare at a minimum of 3x Daily with a toothbrush & Toothpaste if Possible   10 Modified Barium Swallow Recommendations: Does not appear to be clinically indicated at this time      Education  Results, Recommendations, & Plan Discussed with: Patient  Education Provided Regarding: Plan of Care, Thin Liquids Recommendations, Food Consistency Recommendations  Method of Education: Discussion      V.  PLAN OF CARE  Is Skilled SLP Therapy Recommended: No  Frequency of Treatment: Discharge from skilled services  Duration of Treatment: Discharge from skilled services         Pertinent lab findings and imaging:  Admission on 06/11/2023   Component Date Value Ref Range Status    Sodium 06/11/2023 140  135 - 146 mmol/L Final    Potassium 06/11/2023 4.5  3.6 - 5.2 mmol/L Final    Chloride 06/11/2023 110  98 - 110 mmol/L Final    CO2 (Bicarbonate) 06/11/2023 26  20 - 32 mmol/L Final    Anion Gap 06/11/2023 4  3 - 14 mmol/L Final    BUN 06/11/2023 28 (H)  6 - 24 mg/dL Final    Creatinine 16/03/9603 1.68 (H)  0.55 - 1.30 mg/dL Final    eGFRcr 54/01/8118 31 (L)  >=60 mL/min/1.1m*2 Final    Glucose 06/11/2023 139 (H)  70 - 110 mg/dL Final    Fasting? 14/78/2956 Unknown   Final    Calcium 06/11/2023 9.4  8.5 - 10.5 mg/dL Final    AST 21/30/8657 22  6 - 42 U/L  Final    ALT 06/11/2023 27  0 - 55 U/L Final    Alkaline phosphatase 06/11/2023 131 (H)  30 - 130 U/L Final    Protein, total 06/11/2023 7.6  6.0 - 8.4 g/dL Final    Albumin 84/69/6295 4.0  3.2 - 5.0 g/dL Final    Bilirubin, total 06/11/2023 0.4  0.2 - 1.2 mg/dL Final    WBC 28/41/3244 19.6 (H)  4.0 - 11.0 K/uL Final    RBC 06/11/2023 4.31  3.70 -  5.20 M/uL Final    Hemoglobin 06/11/2023 13.6  11.0 - 16.0 g/dL Final    Hematocrit 66/44/0347 40.5  32.0 - 47.0 % Final    MCV 06/11/2023 94.0  80.0 - 100.0 fL Final    MCH 06/11/2023 31.6  26.0 - 34.0 pg Final    MCHC 06/11/2023 33.6  31.0 - 37.0 g/dL Final    RDW-CV 42/59/5638 12.5  11.5 - 14.5 % Final    RDW-SD 06/11/2023 43.8  35.0 - 51.0 fL Final    Platelets 06/11/2023 283  150 - 400 K/uL Final    MPV 06/11/2023 8.7 (L)  9.1 - 12.4 fL Final    NRBC % 06/11/2023 0.0  0.0 - 0.0 % Final    NRBC Absolute 06/11/2023 0.00  0.00 - 2.00 K/uL Final    Color, Ur 06/11/2023 Yellow  Yellow, Dark Yellow Final    Clarity, Ur 06/11/2023 Clear  Clear Final    Specific Gravity, Ur 06/11/2023 >=1.030  1.005 - 1.030 Final    pH, Ur 06/11/2023 6.0  5.0 - 8.0 Final    Protein,Ur 06/11/2023 30 (A)  Negative mg/dL Final    Glucose,Ur 75/64/3329 Negative  Negative mg/dL Final    Ketones, Ur 51/88/4166 Negative  Negative mg/dL Final    Bilirubin, Ur 06/11/2023 Negative  Negative Final    Blood, Ur 06/11/2023 Negative  Negative Final    Urobilinogen, Ur 06/11/2023 0.2  0.2-1.0 E.U./dl E.U./dl Final    Nitrite, Ur 11/28/1599 Negative  Negative Final    Leukocyte Esterase, Ur 06/11/2023 Negative  Negative WBC/uL Final    RBC, Ur 06/11/2023 1  0-4 cells/HPF cells/HPF Final    WBC, Ur 06/11/2023 1  0-5 cells/HPF cells/HPF Final    Epithelial Cells, UR 06/11/2023 3  0-5 cells/HPF cells/HPF Final    Bacteria, Ur 06/11/2023 None Seen  None Seen Final    Casts, Ur 06/11/2023 0  0-4 cells/LPF cells/LPF Final    Lipase 06/11/2023 60  13 - 75 U/L Final    Magnesium 06/11/2023 2.0  1.6 - 2.6 mg/dL Final     Neutrophils % 06/11/2023 90  % Final    Lymphocytes % 06/11/2023 4  % Final    Monocytes % 06/11/2023 6  % Final    Neutrophils Absolute 06/11/2023 17.64 (H)  1.50 - 7.95 K/uL Final    Lymphocytes Absolute 06/11/2023 0.78  0.70 - 4.00 K/uL Final    Monocytes Absolute 06/11/2023 1.18 (H)  0.36 - 0.77 K/uL Final    RBC Morphology 06/11/2023 Normal  Normal, Not Performed Final    PLT Morphology 06/11/2023 Normal  Normal, Not Performed Final    Total WBC Counted 06/11/2023 100   Final    Lactic acid 06/11/2023 3.8 (H)  0.4 - 2.0 mmol/L Final    Lactic acid 06/11/2023 3.7 (H)  0.4 - 2.0 mmol/L Final    Lactic acid 06/12/2023 1.6  0.4 - 2.0 mmol/L Final    Sodium 06/12/2023 141  135 - 146 mmol/L Final    Potassium 06/12/2023 4.1  3.6 - 5.2 mmol/L Final    Chloride 06/12/2023 112 (H)  98 - 110 mmol/L Final    CO2 (Bicarbonate) 06/12/2023 26  20 - 32 mmol/L Final    Anion Gap 06/12/2023 3  3 - 14 mmol/L Final    BUN 06/12/2023 25 (H)  6 - 24 mg/dL Final    Creatinine 09/32/3557 1.32 (H)  0.55 - 1.30 mg/dL Final    eGFRcr 32/20/2542 42 (L)  >=  60 mL/min/1.16m*2 Final    Glucose 06/12/2023 99  70 - 110 mg/dL Final    Fasting? 33/29/5188 Unknown   Final    Calcium 06/12/2023 8.9  8.5 - 10.5 mg/dL Final    WBC 41/66/0630 11.1 (H)  4.0 - 11.0 K/uL Final    RBC 06/12/2023 3.78  3.70 - 5.20 M/uL Final    Hemoglobin 06/12/2023 11.7  11.0 - 16.0 g/dL Final    Hematocrit 16/05/930 35.9  32.0 - 47.0 % Final    MCV 06/12/2023 95.0  80.0 - 100.0 fL Final    MCH 06/12/2023 31.0  26.0 - 34.0 pg Final    MCHC 06/12/2023 32.6  31.0 - 37.0 g/dL Final    RDW-SD 35/57/3220 44.1  35.0 - 51.0 fL Final    RDW-CV 06/12/2023 12.9  11.5 - 14.5 % Final    Platelets 06/12/2023 221  150 - 400 K/uL Final    MPV 06/12/2023 8.7 (L)  9.1 - 12.4 fL Final    NRBC % 06/12/2023 0.0  0.0 - 0.0 % Final    NRBC Absolute 06/12/2023 0.00  0.00 - 2.00 K/uL Final    Sodium 06/13/2023 142  135 - 146 mmol/L Final    Potassium 06/13/2023 3.5 (L)  3.6 - 5.2 mmol/L  Final    Chloride 06/13/2023 113 (H)  98 - 110 mmol/L Final    CO2 (Bicarbonate) 06/13/2023 24  20 - 32 mmol/L Final    Anion Gap 06/13/2023 5  3 - 14 mmol/L Final    BUN 06/13/2023 17  6 - 24 mg/dL Final    Creatinine 25/42/7062 1.26  0.55 - 1.30 mg/dL Final    eGFRcr 37/62/8315 44 (L)  >=60 mL/min/1.73m*2 Final    Glucose 06/13/2023 90  70 - 110 mg/dL Final    Fasting? 17/61/6073 Unknown   Final    Calcium 06/13/2023 8.4 (L)  8.5 - 10.5 mg/dL Final    WBC 71/10/2692 7.7  4.0 - 11.0 K/uL Final    RBC 06/13/2023 3.45 (L)  3.70 - 5.20 M/uL Final    Hemoglobin 06/13/2023 10.8 (L)  11.0 - 16.0 g/dL Final    Hematocrit 85/46/2703 33.3  32.0 - 47.0 % Final    MCV 06/13/2023 96.5  80.0 - 100.0 fL Final    MCH 06/13/2023 31.3  26.0 - 34.0 pg Final    MCHC 06/13/2023 32.4  31.0 - 37.0 g/dL Final    RDW-SD 50/01/3817 45.1  35.0 - 51.0 fL Final    RDW-CV 06/13/2023 12.8  11.5 - 14.5 % Final    Platelets 06/13/2023 194  150 - 400 K/uL Final    MPV 06/13/2023 9.1  9.1 - 12.4 fL Final    NRBC % 06/13/2023 0.0  0.0 - 0.0 % Final    NRBC Absolute 06/13/2023 0.00  0.00 - 2.00 K/uL Final    Magnesium 06/13/2023 1.8  1.6 - 2.6 mg/dL Final    Phosphorus 29/93/7169 2.9  2.4 - 4.9 mg/dL Final    Sodium 67/89/3810 142  135 - 146 mmol/L Final    Potassium 06/14/2023 4.2  3.6 - 5.2 mmol/L Final    Chloride 06/14/2023 112 (H)  98 - 110 mmol/L Final    CO2 (Bicarbonate) 06/14/2023 24  20 - 32 mmol/L Final    Anion Gap 06/14/2023 6  3 - 14 mmol/L Final    BUN 06/14/2023 13  6 - 24 mg/dL Final    Creatinine 17/51/0258 1.24  0.55 - 1.30 mg/dL  Final    eGFRcr 06/14/2023 45 (L)  >=60 mL/min/1.50m*2 Final    Glucose 06/14/2023 84  70 - 110 mg/dL Final    Fasting? 13/12/6576 Unknown   Final    Calcium 06/14/2023 9.0  8.5 - 10.5 mg/dL Final    Extra Tube 46/96/2952 Hold for add-ons.   Final    Auto resulted.        === 06/11/23 ===    XR HAND 3+ VIEWS LEFT    - Impression -  No acute osseous abnormality.      Darrow Bussing, MD 06/11/2023 3:47 PM         Your medication list        CHANGE how you take these medications        Instructions Last Dose Given Next Dose Due   rosuvastatin 10 mg tablet  Commonly known as: Crestor  Start taking on: June 15, 2023  What changed:   medication strength  how much to take  Another medication with the same name was removed. Continue taking this medication, and follow the directions you see here.      Take 1 tablet (10 mg) by mouth once daily for 7 days.              CONTINUE taking these medications        Instructions Last Dose Given Next Dose Due   calcium carbonate 500 mg calcium (1,250 mg) tablet      Take 1 tablet every day by oral route, for hypocalcemia.       docusate sodium 100 mg tablet  Commonly known as: Colace      Take 1 tablet every day by oral route, for constipation.       Ensure High Protein liquid  Generic drug: nutritional drink      Take 240 mL every day by oral route, for protein malnutrition.       ferrous sulfate 160 (50 Fe) mg ER tablet      Take 160 mg by mouth once daily.       lisinopril 20 mg tablet      Take 1 tablet by mouth in the morning.       melatonin 3 mg tablet,disintegrating      Take 1 tablet by mouth at bedtime.       sertraline 25 mg tablet  Commonly known as: Zoloft      Take 25 mg by mouth once daily. Take with 50mg  tablet for a total of 75mg        sertraline 50 mg tablet  Commonly known as: Zoloft      Take 1 tablet by mouth once daily. Take with 25mg  tablet for a total of 75mg               STOP taking these medications      mirtazapine 15 mg tablet  Commonly known as: Remeron        Paxlovid 150-100 mg tablets,dose pack dose pack  Generic drug: nirmatrelvir-ritonavir (renal dosing)                  Where to Get Your Medications        These medications were sent to [facility] [place] [place] - [address]  [address] [place] [address]      Phone: [phone]   rosuvastatin 10 mg tablet              Pending labs and tests:  Results from last 7 days   Lab  Units 06/14/23  0608 06/13/23  0628 06/12/23  0402 06/11/23  1402   WBC AUTO K/uL  --  7.7 11.1* 19.6*   HEMOGLOBIN g/dL  --  62.9* 52.8 41.3   HEMATOCRIT %  --  33.3 35.9 40.5   PLATELETS AUTO K/uL  --  194 221 283   SODIUM mmol/L 142 142 141 140   POTASSIUM mmol/L 4.2 3.5* 4.1 4.5   CHLORIDE mmol/L 112* 113* 112* 110   CO2 mmol/L 24 24 26 26    BUN mg/dL 13 17 25* 28*   CREATININE mg/dL 2.44 0.10 2.72* 5.36*   ANION GAP mmol/L 6 5 3 4    CALCIUM mg/dL 9.0 8.4* 8.9 9.4   MAGNESIUM mg/dL  --  1.8  --  2.0   PHOSPHORUS mg/dL  --  2.9  --   --    ALT U/L  --   --   --  27   AST U/L  --   --   --  22   ALK PHOS U/L  --   --   --  131*   BILIRUBIN TOTAL mg/dL  --   --   --  0.4       __________________________________________________________    Discharge physical exam    Physical Exam:  Vital Signs:  Visit Vitals  BP (!) 167/80 (BP Location: Left arm, Patient Position: Lying) Comment: RN Notified   Pulse 69   Temp 36.8 C (98.3 F) (Oral)   Resp 18   Ht 1.676 m   Wt 74.8 kg   SpO2 96%   BMI 26.63 kg/m   OB Status Hysterectomy   BSA 1.87 m        General: Patient looks comfortable  Confused  HENT: Oral mucosa is moist.  Neck: Supple, Non-tender  Respiratory: Diminished  Cardiovascular: Normal rate, Regular rhythm, s1 s2 appreciated  Gastrointestinal: Soft, Non-tender, Non-distended.  Musculoskeletal: moves extremities   integumentary: Warm  __________________________________________________________  F/U with PCP in 7-10 days after discharge        Family was counseled and agrees to this discharge plan.      Signed:  Hanley Seamen.  June 14, 2023  12:26 PM    FACE TO FACE PATIENT COUNSELING COORDINATING CARE MORE THAN 50% OF ENCOUNTER TIME : YES   TOTAL ENCOUNTER TIME: DSCHG>30" minutes.      Discharge Diagnosis  Alcohol withdrawal delirium (Multi-HCC)

## 2023-06-14 NOTE — Progress Notes (Signed)
06/14/23 1449   Case Management Discharge Note    Ambulance Log  [facility]     Case Management Progress Note: Pt medically cleared for d/c today, she will go to [facility] for STR prior to return to the [facility].  S/W dtr who is in agreement with SNF transfer and d/c today.  Initially needed ins auth but this has been obtained and transport booked for 7pm this evening, clinical team nd SNF notified.    Melvyn Neth, RN  06/14/2023  2:51 PM

## 2023-06-22 IMAGING — MR RM - PELVE (NAO INCLUI ARTICULACOES COXOFEMORAIS)
10 series · 16 of 16 positions shown · non-contrast
Comparison: none

[Series 2: T2 · sagittal · 3.0mm · 0.47mm/px · 1 of 34 slices shown (1 of 3)]
[im 1/34]
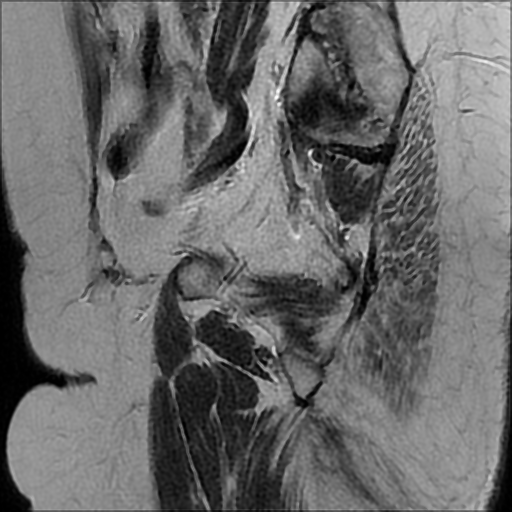

[Series 3: T2 · coronal · 2.0mm · 0.70mm/px · 3 of 172 slices shown (2 of 3)]
[im 1/172]
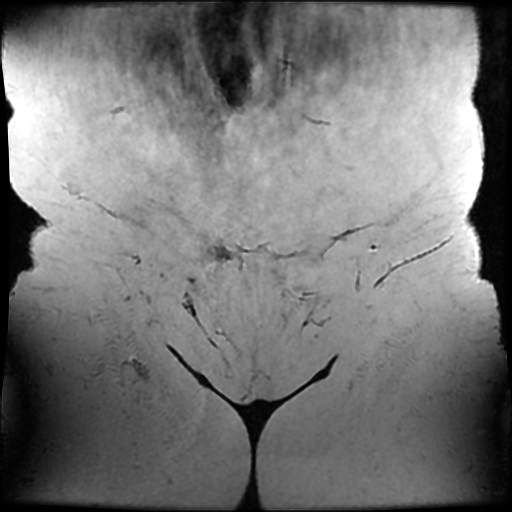
[im 86/172]
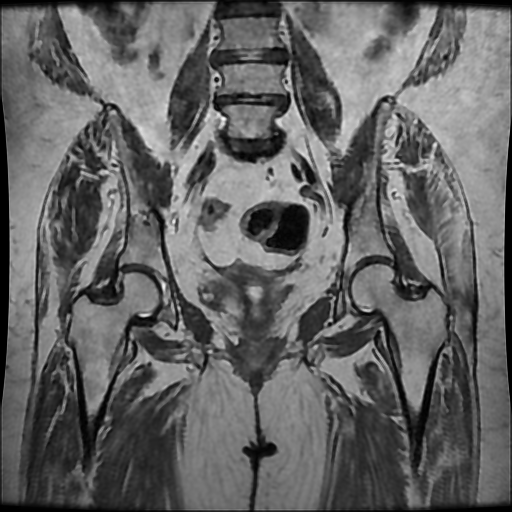
[im 172/172]
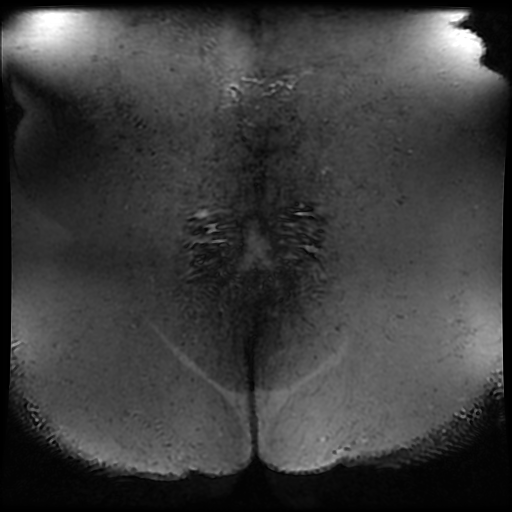

[Series 4: T2 · axial · 5.0mm · 0.47mm/px · 1 of 36 slices shown (3 of 3)]
[im 1/36]
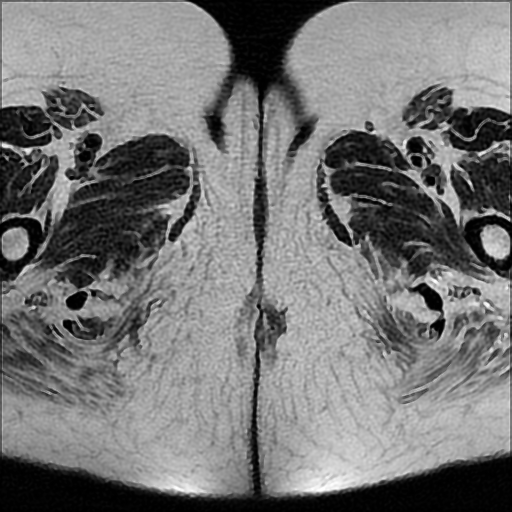

[Series 5: ax difusão · axial · 5.0mm · 0.94mm/px · 1 of 72 slices shown]
[im 1/72]
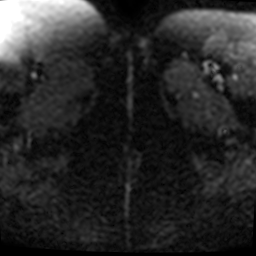

[Series 6: T1 dynamic · axial · 3.9mm · 0.64mm/px · z∈[-182,+21]mm · 2 of 104 slices shown (1 of 3)]
[im 1/104]
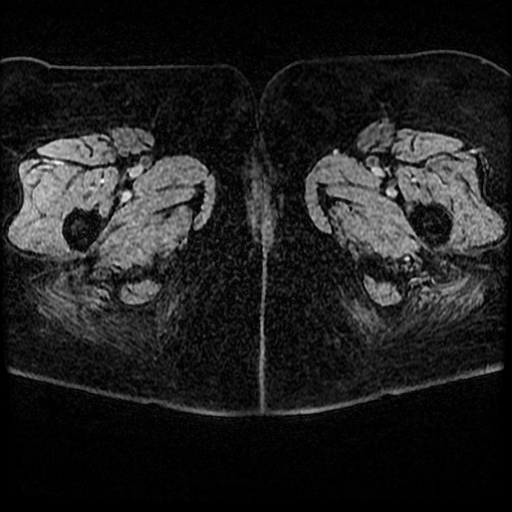
[im 104/104]
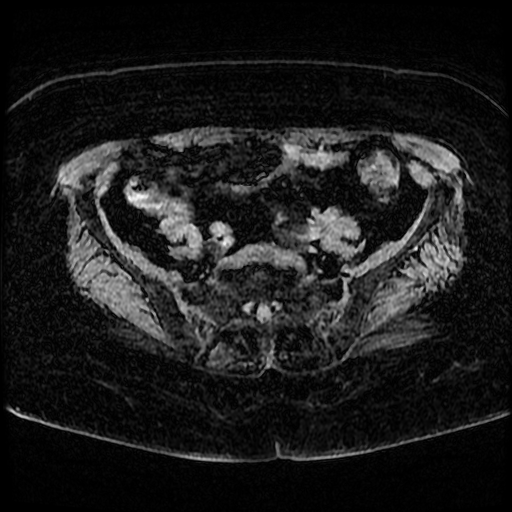

[Series 7: (person_name): 3d saglava · sagittal · 3.9mm · 0.47mm/px · 2 of 84 slices shown]
[im 1/84]
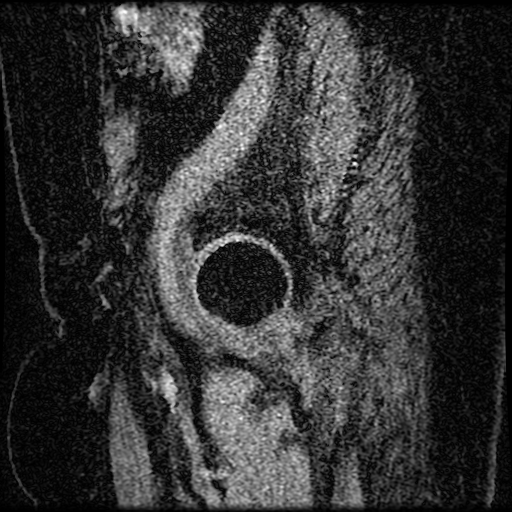
[im 84/84]
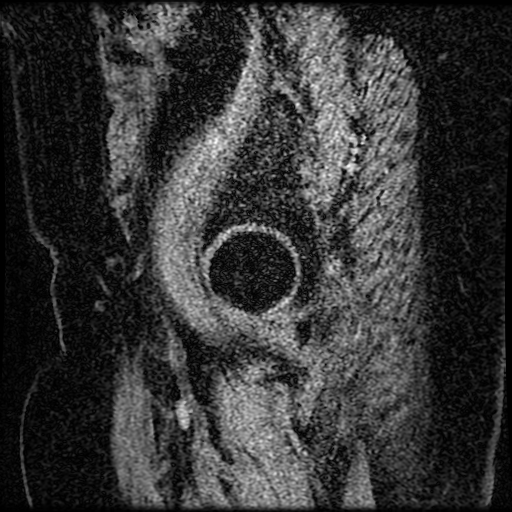

[Series 550: ADC · axial · 5.0mm · 0.94mm/px · 1 of 36 slices shown]
[im 1/36]
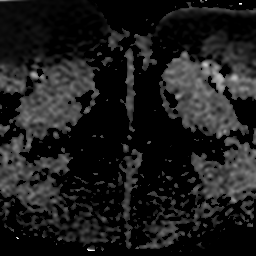

[Series 551: eadc · axial · 5.0mm · 0.94mm/px · 1 of 36 slices shown]
[im 1/36]
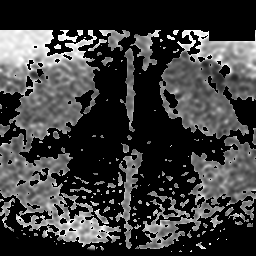

[Series 601: T1 dynamic · axial · 3.9mm · 0.64mm/px · z∈[-182,+21]mm · 2 of 104 slices shown (2 of 3)]
[im 1/104]
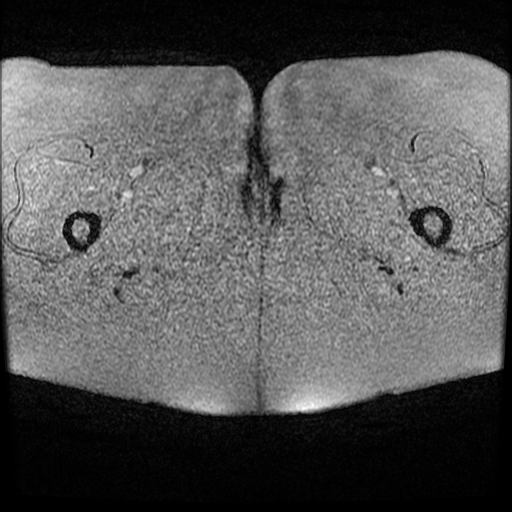
[im 104/104]
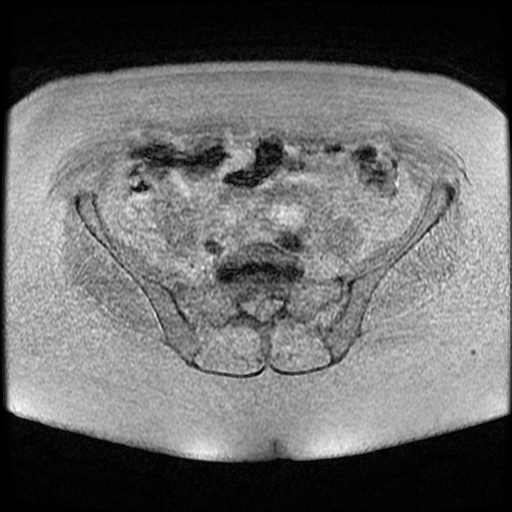

[Series 602: T1 dynamic · axial · 3.9mm · 0.64mm/px · z∈[-182,+21]mm · 2 of 104 slices shown (3 of 3)]
[im 1/104]
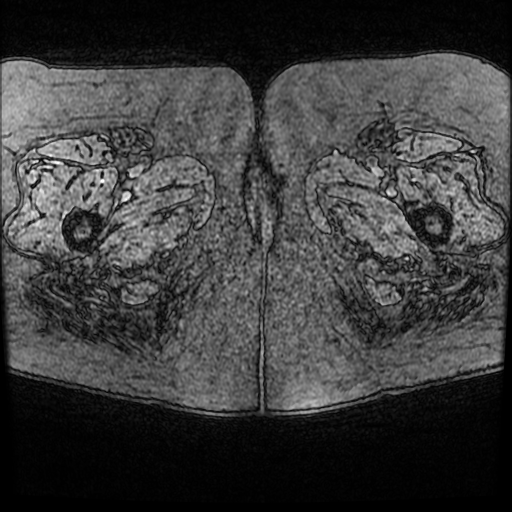
[im 104/104]
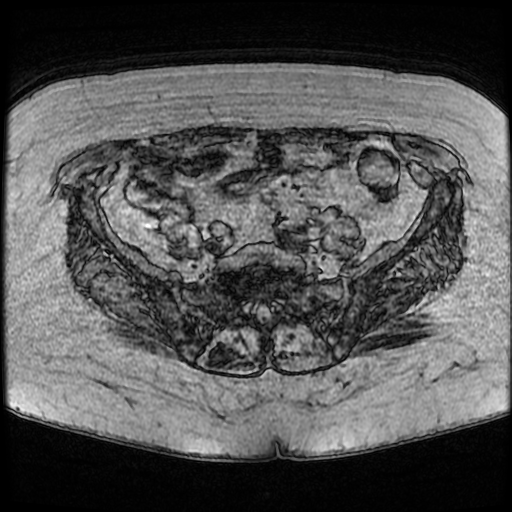

[16 of 16 positions shown; findings below may reference images not displayed]

Médico: -

Técnica:
Foram obtidas imagens multiplanares ponderadas em T1 e T2, sem a administração intravenosa do meio de
contraste paramagnético (gadolínio).
Análise:
Bexiga parcialmente repleta, com conteúdo homogêneo e paredes regulares.
Útero ausente, com loja cirúrgica de aspecto preservado, sem sinais de restrição à difusão.
Ovários não caracterizados no presente exame.
RESSONÂNCIA MAGNÉTICA DE PELVE
Ausência de líquido livre na pelve.
Canal anal e reto sem particularidades.
Ausência de linfonodomegalias pélvicas.
Alças intestinais de aspecto habitual.
Peritôneo e retroperitôneo sem alterações.
Conclusão:
Status pós=histerectomia prévia de aspecto preservado.
[facility] - [address] [place] [id number], [place]
